# Patient Record
Sex: Female | Born: 1995 | Race: White | Hispanic: No | Smoking: Never smoker
Health system: Southern US, Community
[De-identification: ages and names within clinical notes are randomized; demographics above are authoritative.]

## PROBLEM LIST (undated history)

## (undated) DIAGNOSIS — M419 Scoliosis, unspecified: Secondary | ICD-10-CM

## (undated) HISTORY — DX: Scoliosis, unspecified: M41.9

## (undated) HISTORY — PX: WISDOM TOOTH EXTRACTION: SHX21

---
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
## 2013-01-17 NOTE — L&D Delivery Note (Signed)
Delivery Note At 5:10 PM a viable and healthy female was delivered via Vaginal, Spontaneous Delivery (Presentation: ; Occiput Anterior).  APGAR: 9, 9; weight  .   Placenta status: intact: Schultz,Cord:  with the following complications: Long.   Anesthesia: Epidural  Episiotomy: None Lacerations: 1st degree; not reparied Suture Repair: N/A Est. Blood Loss (mL): 250 Mother doing well, bonding with baby skin to skin Family at bedside for support  Mom to postpartum.  Baby to Couplet care / Skin to Skin.  Sheryl Pitts,Sheryl Pitts 11/27/2013, 5:30 PM   Attended by me No difficulty with shoulders Agree with note  Sheryl Pitts, CNM

## 2013-05-02 ENCOUNTER — Encounter (HOSPITAL_COMMUNITY): Payer: Self-pay | Admitting: Emergency Medicine

## 2013-05-02 ENCOUNTER — Emergency Department (HOSPITAL_COMMUNITY)
Admission: EM | Admit: 2013-05-02 | Discharge: 2013-05-03 | Disposition: A | Discharge status: Home / Self Care | Payer: Medicaid Other | Attending: Emergency Medicine | Admitting: Emergency Medicine

## 2013-05-02 DIAGNOSIS — O219 Vomiting of pregnancy, unspecified: Secondary | ICD-10-CM | POA: Insufficient documentation

## 2013-05-02 DIAGNOSIS — R1032 Left lower quadrant pain: Secondary | ICD-10-CM | POA: Insufficient documentation

## 2013-05-02 DIAGNOSIS — O9989 Other specified diseases and conditions complicating pregnancy, childbirth and the puerperium: Secondary | ICD-10-CM | POA: Insufficient documentation

## 2013-05-02 DIAGNOSIS — R110 Nausea: Secondary | ICD-10-CM | POA: Insufficient documentation

## 2013-05-02 DIAGNOSIS — N939 Abnormal uterine and vaginal bleeding, unspecified: Secondary | ICD-10-CM

## 2013-05-02 DIAGNOSIS — O209 Hemorrhage in early pregnancy, unspecified: Secondary | ICD-10-CM | POA: Insufficient documentation

## 2013-05-02 DIAGNOSIS — Z3490 Encounter for supervision of normal pregnancy, unspecified, unspecified trimester: Secondary | ICD-10-CM

## 2013-05-02 DIAGNOSIS — R1031 Right lower quadrant pain: Secondary | ICD-10-CM | POA: Insufficient documentation

## 2013-05-02 LAB — CBC WITH DIFFERENTIAL/PLATELET
Basophils Absolute: 0 10*3/uL (ref 0.0–0.1)
Basophils Relative: 0 % (ref 0–1)
EOS ABS: 0.1 10*3/uL (ref 0.0–0.7)
EOS PCT: 1 % (ref 0–5)
HEMATOCRIT: 39.8 % (ref 36.0–46.0)
Hemoglobin: 13.6 g/dL (ref 12.0–15.0)
LYMPHS ABS: 3.2 10*3/uL (ref 0.7–4.0)
Lymphocytes Relative: 23 % (ref 12–46)
MCH: 30.7 pg (ref 26.0–34.0)
MCHC: 34.2 g/dL (ref 30.0–36.0)
MCV: 89.8 fL (ref 78.0–100.0)
MONO ABS: 1 10*3/uL (ref 0.1–1.0)
Monocytes Relative: 7 % (ref 3–12)
Neutro Abs: 9.7 10*3/uL — ABNORMAL HIGH (ref 1.7–7.7)
Neutrophils Relative %: 69 % (ref 43–77)
PLATELETS: 206 10*3/uL (ref 150–400)
RBC: 4.43 MIL/uL (ref 3.87–5.11)
RDW: 12.5 % (ref 11.5–15.5)
WBC: 14 10*3/uL — ABNORMAL HIGH (ref 4.0–10.5)

## 2013-05-02 LAB — I-STAT CHEM 8, ED
BUN: 3 mg/dL — AB (ref 6–23)
CHLORIDE: 105 meq/L (ref 96–112)
CREATININE: 0.6 mg/dL (ref 0.50–1.10)
Calcium, Ion: 1.18 mmol/L (ref 1.12–1.23)
GLUCOSE: 92 mg/dL (ref 70–99)
HCT: 42 % (ref 36.0–46.0)
Hemoglobin: 14.3 g/dL (ref 12.0–15.0)
POTASSIUM: 3.7 meq/L (ref 3.7–5.3)
SODIUM: 139 meq/L (ref 137–147)
TCO2: 19 mmol/L (ref 0–100)

## 2013-05-02 LAB — URINALYSIS, ROUTINE W REFLEX MICROSCOPIC
BILIRUBIN URINE: NEGATIVE
GLUCOSE, UA: NEGATIVE mg/dL
KETONES UR: NEGATIVE mg/dL
Nitrite: NEGATIVE
PROTEIN: NEGATIVE mg/dL
Specific Gravity, Urine: 1.028 (ref 1.005–1.030)
Urobilinogen, UA: 1 mg/dL (ref 0.0–1.0)
pH: 7 (ref 5.0–8.0)

## 2013-05-02 LAB — POC URINE PREG, ED: Preg Test, Ur: POSITIVE — AB

## 2013-05-02 LAB — WET PREP, GENITAL
CLUE CELLS WET PREP: NONE SEEN
TRICH WET PREP: NONE SEEN
Yeast Wet Prep HPF POC: NONE SEEN

## 2013-05-02 LAB — ABO/RH: ABO/RH(D): O POS

## 2013-05-02 LAB — HCG, QUANTITATIVE, PREGNANCY: hCG, Beta Chain, Quant, S: 70614 m[IU]/mL — ABNORMAL HIGH (ref <5)

## 2013-05-02 LAB — URINE MICROSCOPIC-ADD ON

## 2013-05-02 LAB — OB RESULTS CONSOLE GC/CHLAMYDIA
CHLAMYDIA, DNA PROBE: NEGATIVE
Gonorrhea: NEGATIVE

## 2013-05-02 NOTE — ED Notes (Addendum)
Pt reports that she is having vaginal bleeding that started this evening. Pt noticed that the bleeding first covered her underwear then it lightened up. Pt states that she is unconfirmed [redacted] weeks pregnant with + home preg test and experiencing vomiting. Pt states that she noticed blood in vomit then some bile afterwards. Pt states that she has lower ab cramping that comes and goes. Pt alert and ambulatory to triage. Family in room.

## 2013-05-02 NOTE — ED Provider Notes (Signed)
CSN: [id number]     Arrival date & time 05/02/13  2104 History   First MD Initiated Contact with Patient 05/02/13 2249     Chief Complaint  Patient presents with  . Abdominal Pain  . Emesis     (Consider location/radiation/quality/duration/timing/severity/associated sxs/prior Treatment) The history is provided by the patient and medical records. No language interpreter was used.    Sheryl Pitts is a 18 y.o. female  G1P0 with no known medical problems presents to the Emergency Department complaining of intermittent lower abd pain onset 8pm and had associated vaginal bleeding noted several minutes after the pain began.  Pt also reports N/V around 8:30PM tonight.  Pt reports she has had some nausea and vomiting with the pregnancy in the past.  She reports several streaks of blood in the emesis on the last time, but no bilious matter.  Patient reports she is [redacted] weeks pregnant based on her last menstrual cycle which was in February.   She has not yet received any prenatal care but she is taking a prenatal vitamin.   She reports positive home pregnancy test. Patient has no aggravating or alleviating factors.  Pt denies fever, chills, headache, neck pain, chest pain, shortness of breath, diarrhea, weakness, dizziness, syncope, dysuria, hematuria.Marland Kitchen      History reviewed. No pertinent past medical history. No past surgical history on file. No family history on file. History  Substance Use Topics  . Smoking status: Never Smoker   . Smokeless tobacco: Not on file  . Alcohol Use: Not on file   OB History   Grav Para Term Preterm Abortions TAB SAB Ect Mult Living   1              Review of Systems  Constitutional: Negative for fever, diaphoresis, appetite change, fatigue and unexpected weight change.  HENT: Negative for mouth sores and trouble swallowing.   Respiratory: Negative for cough, chest tightness, shortness of breath, wheezing and stridor.   Cardiovascular: Negative for chest pain and  palpitations.  Gastrointestinal: Positive for nausea, vomiting and abdominal pain (lower). Negative for diarrhea, constipation, blood in stool, abdominal distention and rectal pain.  Genitourinary: Positive for vaginal bleeding. Negative for dysuria, urgency, frequency, hematuria, flank pain and difficulty urinating.  Musculoskeletal: Negative for back pain, neck pain and neck stiffness.  Skin: Negative for rash.  Neurological: Negative for weakness.  Hematological: Negative for adenopathy.  Psychiatric/Behavioral: Negative for confusion.  All other systems reviewed and are negative.     Allergies  Review of patient's allergies indicates no known allergies.  Home Medications   Prior to Admission medications   Medication Sig Start Date End Date Taking? Authorizing Provider  Prenatal Vit-Fe Fumarate-FA (PRENATAL MULTIVITAMIN) TABS tablet Take 1 tablet by mouth daily at 12 noon.   Yes Historical Provider, MD   BP 118/48  Pulse 72  Temp(Src) 99.9 F (37.7 C) (Oral)  Resp 20  SpO2 99%  LMP 02/17/2013 Physical Exam  Nursing note and vitals reviewed. Constitutional: She appears well-developed and well-nourished. No distress.  Awake, alert, nontoxic appearance  HENT:  Head: Normocephalic and atraumatic.  Mouth/Throat: Oropharynx is clear and moist. No oropharyngeal exudate.  Eyes: Conjunctivae are normal. No scleral icterus.  Neck: Normal range of motion. Neck supple.  Cardiovascular: Normal rate, regular rhythm, normal heart sounds and intact distal pulses.   No murmur heard. Pulmonary/Chest: Effort normal and breath sounds normal. No respiratory distress. She has no wheezes.  Clear and equal breath sounds  Abdominal: Soft.  Bowel sounds are normal. She exhibits no mass. There is no hepatosplenomegaly. There is tenderness in the right lower quadrant, suprapubic area and left lower quadrant. There is no rebound, no guarding and no CVA tenderness. Hernia confirmed negative in the  right inguinal area and confirmed negative in the left inguinal area.    Genitourinary: Uterus normal. Pelvic exam was performed with patient supine. There is no rash, tenderness, lesion or injury on the right labia. There is no rash, tenderness, lesion or injury on the left labia. Uterus is not deviated, not enlarged, not fixed and not tender. Cervix exhibits no motion tenderness, no discharge and no friability. Right adnexum displays no mass, no tenderness and no fullness. Left adnexum displays no mass, no tenderness and no fullness. There is bleeding (small) around the vagina. No erythema or tenderness around the vagina. No foreign body around the vagina. No signs of injury around the vagina. Vaginal discharge (thin) found.  No CMT  Musculoskeletal: Normal range of motion. She exhibits no edema.  Lymphadenopathy:    She has no cervical adenopathy.       Right: No inguinal adenopathy present.       Left: No inguinal adenopathy present.  Neurological: She is alert.  Speech is clear and goal oriented Moves extremities without ataxia  Skin: Skin is warm and dry. No rash noted. She is not diaphoretic.  Psychiatric: She has a normal mood and affect.    ED Course  Procedures (including critical care time) Labs Review Labs Reviewed  WET PREP, GENITAL - Abnormal; Notable for the following:    WBC, Wet Prep HPF POC MANY (*)    All other components within normal limits  CBC WITH DIFFERENTIAL - Abnormal; Notable for the following:    WBC 14.0 (*)    Neutro Abs 9.7 (*)    All other components within normal limits  URINALYSIS, ROUTINE W REFLEX MICROSCOPIC - Abnormal; Notable for the following:    APPearance CLOUDY (*)    Hgb urine dipstick MODERATE (*)    Leukocytes, UA MODERATE (*)    All other components within normal limits  HCG, QUANTITATIVE, PREGNANCY - Abnormal; Notable for the following:    hCG, Beta Chain, Sheryl Pitts, S [phone] (*)    All other components within normal limits  URINE  MICROSCOPIC-ADD ON - Abnormal; Notable for the following:    Squamous Epithelial / LPF FEW (*)    Bacteria, UA MANY (*)    All other components within normal limits  POC URINE PREG, ED - Abnormal; Notable for the following:    Preg Test, Ur POSITIVE (*)    All other components within normal limits  I-STAT CHEM 8, ED - Abnormal; Notable for the following:    BUN 3 (*)    All other components within normal limits  GC/CHLAMYDIA PROBE AMP  URINE CULTURE  ABO/RH    Imaging Review No results found.   EKG Interpretation None      MDM   Final diagnoses:  Vaginal bleeding  Pregnancy     Sheryl Pitts presents at approximately [redacted] weeks gestation with vaginal bleeding and lower abdominal cramping for several hours. Patient O+ and does not need RhoGAM. Urinalysis with evidence of questionable urinary tract infection we'll treat and culture. Urine pregnancy test positive.  Pelvic exam with small amount of blood in the vaginal vault and then discharge, nonodorous. No cervical motion tenderness.  Will obtain ultrasound OB to rule out ectopic pregnancy.  1:59 AM US verbal  report shows IUP at 11 weeks and 1 day with FHR 171 and no ectopic pregnancy.  Pt tolerating by mouth without difficulty here in the emergency department. She also reports spontaneously resolved abd pain.  Pt has been instructed to f/u with OB/GYN tomorrow for further evaluation.    It has been determined that no acute conditions requiring further emergency intervention are present at this time. The patient/guardian have been advised of the diagnosis and plan. We have discussed signs and symptoms that warrant return to the ED, such as changes or worsening in symptoms including fever, intractable vomiting or increasing vaginal bleeding.   Vital signs are stable at discharge.   BP 118/48  Pulse 72  Temp(Src) 99.9 F (37.7 C) (Oral)  Resp 20  SpO2 99%  LMP 02/17/2013  Patient/guardian has voiced understanding and agreed  to follow-up with the PCP or specialist.      Sheryl ForthHannah Cailee Blanke, PA-C 05/03/13 [phone]

## 2013-05-03 ENCOUNTER — Emergency Department (HOSPITAL_COMMUNITY): Payer: Medicaid Other

## 2013-05-03 LAB — GC/CHLAMYDIA PROBE AMP
CT Probe RNA: NEGATIVE
GC PROBE AMP APTIMA: NEGATIVE

## 2013-05-03 MED ORDER — NITROFURANTOIN MONOHYD MACRO 100 MG PO CAPS: 100.0000 mg | ORAL_CAPSULE | Freq: Two times a day (BID) | ORAL | Status: DC

## 2013-05-03 MED ORDER — ONDANSETRON 8 MG PO TBDP: ORAL_TABLET | ORAL | Status: DC

## 2013-05-03 NOTE — ED Provider Notes (Signed)
Medical screening examination/treatment/procedure(s) were performed by non-physician practitioner and as supervising physician I was immediately available for consultation/collaboration.   EKG Interpretation None        Adahlia Stembridge L Bertel Venard, MD 05/03/13 0527 

## 2013-05-03 NOTE — Discharge Instructions (Signed)
1. Medications: zofran, usual home medications 2. Treatment: rest, drink plenty of fluids,  3. Follow Up: Please followup with OB/GYN in 24-48 hours for discussion of your diagnoses and further evaluation after today's visit; please [facility] for further vaginal bleeding or increasing or concerning symptoms including high fevers or intractable vomiting   Vaginal Bleeding During Pregnancy, First Trimester A small amount of bleeding (spotting) from the vagina is relatively common in early pregnancy. It usually stops on its own. Various things may cause bleeding or spotting in early pregnancy. Some bleeding may be related to the pregnancy, and some may not. In most cases, the bleeding is normal and is not a problem. However, bleeding can also be a sign of something serious. Be sure to tell your health care provider about any vaginal bleeding right away. Some possible causes of vaginal bleeding during the first trimester include:  Infection or inflammation of the cervix.  Growths (polyps) on the cervix.  Miscarriage or threatened miscarriage.  Pregnancy tissue has developed outside of the uterus and in a fallopian tube (tubal pregnancy).  Tiny cysts have developed in the uterus instead of pregnancy tissue (molar pregnancy). HOME CARE INSTRUCTIONS  Watch your condition for any changes. The following actions may help to lessen any discomfort you are feeling:  Follow your health care provider's instructions for limiting your activity. If your health care provider orders bed rest, you may need to stay in bed and only get up to use the bathroom. However, your health care provider may allow you to continue light activity.  If needed, make plans for someone to help with your regular activities and responsibilities while you are on bed rest.  Keep track of the number of pads you use each day, how often you change pads, and how soaked (saturated) they are. Write this down.  Do  not use tampons. Do not douche.  Do not have sexual intercourse or orgasms until approved by your health care provider.  If you pass any tissue from your vagina, save the tissue so you can show it to your health care provider.  Only take over-the-counter or prescription medicines as directed by your health care provider.  Do not take aspirin because it can make you bleed.  Keep all follow-up appointments as directed by your health care provider. SEEK MEDICAL CARE IF:  You have any vaginal bleeding during any part of your pregnancy.  You have cramps or labor pains. SEEK IMMEDIATE MEDICAL CARE IF:   You have severe cramps in your back or belly (abdomen).  You have a fever, not controlled by medicine.  You pass large clots or tissue from your vagina.  Your bleeding increases.  You feel lightheaded or weak, or you have fainting episodes.  You have chills.  You are leaking fluid or have a gush of fluid from your vagina.  You pass out while having a bowel movement. MAKE SURE YOU:  Understand these instructions.  Will watch your condition.  Will get help right away if you are not doing well or get worse. Document Released: 10/13/2004 Document Revised: 10/24/2012 Document Reviewed: 09/10/2012 [facility] Patient Information 2014 [employer], [place].

## 2013-05-04 LAB — URINE CULTURE

## 2013-05-23 ENCOUNTER — Encounter: Payer: Medicaid Other | Admitting: Medical

## 2013-05-23 ENCOUNTER — Telehealth: Payer: Self-pay

## 2013-05-23 ENCOUNTER — Encounter: Payer: Self-pay | Admitting: Medical

## 2013-05-23 NOTE — Telephone Encounter (Signed)
Per Joseph BerkshireJulie Ethier, PA, patient does not need to be seen for f/u evaluation of bleeding on 05/02/13 in [facility]. Ultrasound on 4/16 confirms viable IUP 11w 0d. Pt. Needs a new OB appointment. Called pt. To see how her symptoms have been. Pt. Stated she spotted for 1.5 weeks after ED visit, it stopped, and then she started spotting two weeks ago, it stopped a few days ago. Pt. States she is not spotting today. Denies ever bleeding heavily after ED visit. Denies pain or cramping. Consulted Joseph BerkshireJulie Ethier, NP who states pt. Should have new OB appointment, no reason to be seen today. Advised pt. That should she develop any heavy bleeding, cramping, pain she needs to go to [facility]. Pt. Verbalized understanding and asks if she should start a PNV. Advised pt. She may get any PNV from any pharmacy. Pt. Also asked about ultrasound to see the sex of the baby. Informed pt. We do that between 18-20 weeks so at the time of her appointment we will be able to schedule the ultrasound. Pt. Verbalized understanding and gratitude. No further questions or concerns.

## 2013-06-26 ENCOUNTER — Ambulatory Visit: Payer: BC Managed Care – PPO | Admitting: Family

## 2013-06-26 ENCOUNTER — Encounter: Payer: Self-pay | Admitting: Family

## 2013-06-26 VITALS — Ht 66.0 in | Wt 232.4 lb

## 2013-06-26 DIAGNOSIS — O0930 Supervision of pregnancy with insufficient antenatal care, unspecified trimester: Secondary | ICD-10-CM | POA: Insufficient documentation

## 2013-06-26 DIAGNOSIS — O0932 Supervision of pregnancy with insufficient antenatal care, second trimester: Secondary | ICD-10-CM

## 2013-06-26 LAB — POCT URINALYSIS DIP (DEVICE)
Bilirubin Urine: NEGATIVE
GLUCOSE, UA: NEGATIVE mg/dL
HGB URINE DIPSTICK: NEGATIVE
Ketones, ur: NEGATIVE mg/dL
NITRITE: NEGATIVE
PROTEIN: NEGATIVE mg/dL
SPECIFIC GRAVITY, URINE: 1.02 (ref 1.005–1.030)
UROBILINOGEN UA: 0.2 mg/dL (ref 0.0–1.0)
pH: 6.5 (ref 5.0–8.0)

## 2013-06-26 MED ORDER — FLUCONAZOLE 150 MG PO TABS: 150.0000 mg | ORAL_TABLET | Freq: Once | ORAL | Status: DC

## 2013-06-26 NOTE — Progress Notes (Signed)
   Subjective:    Sheryl Pitts is a G1P0 [redacted]w[redacted]d being seen today for her first obstetrical visit.  Her obstetrical history is significant for bleeding in early pregnancy. Patient does intend to breast feed. Pregnancy history fully reviewed.  At visit with FOB Hoy Morn.  Met in high school.    Patient reports no bleeding, no contractions and vaginal irritation.  Filed Vitals:   06/26/13 1001 06/26/13 1006  Height:  $Remove'5\' 6"'TfdlxSd$  (1.676 m)  Weight: 232 lb 6.4 oz (105.416 kg)     HISTORY: OB History  Gravida Para Term Preterm AB SAB TAB Ectopic Multiple Living  1             # Outcome Date GA Lbr Len/2nd Weight Sex Delivery Anes PTL Lv  1 CUR              Past Medical History  Diagnosis Date  . Medical history non-contributory    Past Surgical History  Procedure Laterality Date  . Wisdom tooth extraction     Family History  Problem Relation Age of Onset  . Hypertension Father   . Diabetes Neg Hx      Exam   Exam   Ht $R'5\' 6"'Nq$  (1.676 m)  Wt 232 lb 6.4 oz (105.416 kg)  BMI 37.53 kg/m2  LMP 02/17/2013 Uterine Size: size equals dates  System: Breast:  No nipple retraction or dimpling, No nipple discharge or bleeding, No axillary or supraclavicular adenopathy, Normal to palpation without dominant masses   Skin: normal coloration and turgor, no rashes    Neurologic: negative   Extremities: normal strength, tone, and muscle mass   HEENT neck supple with midline trachea and thyroid without masses   Mouth/Teeth mucous membranes moist, pharynx normal without lesions   Neck supple and no masses   Cardiovascular: regular rate and rhythm, no murmurs or gallops   Respiratory:  appears well, vitals normal, no respiratory distress, acyanotic, normal RR, neck free of mass or lymphadenopathy, chest clear, no wheezing, crepitations, rhonchi, normal symmetric air entry   Abdomen: soft, non-tender; bowel sounds normal; no masses,  no organomegaly   Urinary: urethral meatus normal         Assessment:    Pregnancy:  18 yo G1P0 at [redacted]w[redacted]d wks IUP Vaginitis - Likely Yeast  There are no active problems to display for this patient.       Plan:     Initial labs drawn. Early 1 hr glucola. Prenatal vitamins. RX Diflucan Problem list reviewed and updated. Genetic Screening discussed Quad Screen: declined. Ultrasound discussed; fetal survey: ordered. Follow up in 4 weeks.   [facility] 06/26/2013

## 2013-06-26 NOTE — Progress Notes (Signed)
1hr gtt today due to pregravida bmi of >30. Discussed recommended weight gain of 11-20 lbs.

## 2013-06-27 LAB — OBSTETRIC PANEL
Antibody Screen: NEGATIVE
BASOS ABS: 0 10*3/uL (ref 0.0–0.1)
Basophils Relative: 0 % (ref 0–1)
EOS ABS: 0.1 10*3/uL (ref 0.0–0.7)
EOS PCT: 1 % (ref 0–5)
HCT: 36.8 % (ref 36.0–46.0)
HEP B S AG: NEGATIVE
Hemoglobin: 12.9 g/dL (ref 12.0–15.0)
LYMPHS ABS: 1.7 10*3/uL (ref 0.7–4.0)
Lymphocytes Relative: 16 % (ref 12–46)
MCH: 30.9 pg (ref 26.0–34.0)
MCHC: 35.1 g/dL (ref 30.0–36.0)
MCV: 88 fL (ref 78.0–100.0)
Monocytes Absolute: 0.6 10*3/uL (ref 0.1–1.0)
Monocytes Relative: 6 % (ref 3–12)
NEUTROS PCT: 77 % (ref 43–77)
Neutro Abs: 8.1 10*3/uL — ABNORMAL HIGH (ref 1.7–7.7)
PLATELETS: 198 10*3/uL (ref 150–400)
RBC: 4.18 MIL/uL (ref 3.87–5.11)
RDW: 13.4 % (ref 11.5–15.5)
Rh Type: POSITIVE
Rubella: 3.95 Index — ABNORMAL HIGH (ref <0.90)
WBC: 10.5 10*3/uL (ref 4.0–10.5)

## 2013-06-27 LAB — GLUCOSE TOLERANCE, 1 HOUR (50G) W/O FASTING: GLUCOSE 1 HOUR GTT: 115 mg/dL (ref 70–140)

## 2013-06-27 LAB — HIV ANTIBODY (ROUTINE TESTING W REFLEX): HIV: NONREACTIVE

## 2013-06-28 LAB — PRESCRIPTION MONITORING PROFILE (19 PANEL)
AMPHETAMINE/METH: NEGATIVE ng/mL
Barbiturate Screen, Urine: NEGATIVE ng/mL
Benzodiazepine Screen, Urine: NEGATIVE ng/mL
Buprenorphine, Urine: NEGATIVE ng/mL
CARISOPRODOL, URINE: NEGATIVE ng/mL
CREATININE, URINE: 185.5 mg/dL (ref >20.0)
Cannabinoid Scrn, Ur: NEGATIVE ng/mL
Cocaine Metabolites: NEGATIVE ng/mL
ECSTASY: NEGATIVE ng/mL
Fentanyl, Ur: NEGATIVE ng/mL
MEPERIDINE UR: NEGATIVE ng/mL
METHADONE SCREEN, URINE: NEGATIVE ng/mL
METHAQUALONE SCREEN (URINE): NEGATIVE ng/mL
NITRITES URINE, INITIAL: NEGATIVE ug/mL
OXYCODONE SCRN UR: NEGATIVE ng/mL
Opiate Screen, Urine: NEGATIVE ng/mL
PH URINE, INITIAL: 6.8 pH (ref 4.5–8.9)
PROPOXYPHENE: NEGATIVE ng/mL
Phencyclidine, Ur: NEGATIVE ng/mL
TAPENTADOLUR: NEGATIVE ng/mL
TRAMADOL UR: NEGATIVE ng/mL
Zolpidem, Urine: NEGATIVE ng/mL

## 2013-06-28 LAB — CULTURE, OB URINE: Colony Count: 9000

## 2013-07-02 ENCOUNTER — Encounter: Payer: Self-pay | Admitting: Family

## 2013-07-03 ENCOUNTER — Ambulatory Visit (HOSPITAL_COMMUNITY)
Admission: RE | Admit: 2013-07-03 | Discharge: 2013-07-03 | Disposition: A | Discharge status: Home / Self Care | Payer: Medicaid Other | Source: Ambulatory Visit | Attending: Family | Admitting: Family

## 2013-07-03 DIAGNOSIS — O3680X0 Pregnancy with inconclusive fetal viability, not applicable or unspecified: Secondary | ICD-10-CM | POA: Insufficient documentation

## 2013-07-03 DIAGNOSIS — O0932 Supervision of pregnancy with insufficient antenatal care, second trimester: Secondary | ICD-10-CM

## 2013-07-03 DIAGNOSIS — Z3689 Encounter for other specified antenatal screening: Secondary | ICD-10-CM | POA: Insufficient documentation

## 2013-07-04 ENCOUNTER — Encounter: Payer: Self-pay | Admitting: Family

## 2013-07-24 ENCOUNTER — Encounter: Payer: Medicaid Other | Admitting: Advanced Practice Midwife

## 2013-07-31 ENCOUNTER — Ambulatory Visit (INDEPENDENT_AMBULATORY_CARE_PROVIDER_SITE_OTHER): Payer: Medicaid Other | Admitting: Advanced Practice Midwife

## 2013-07-31 VITALS — BP 138/68 | HR 97 | Temp 98.6°F | Wt 241.7 lb

## 2013-07-31 DIAGNOSIS — Z3402 Encounter for supervision of normal first pregnancy, second trimester: Secondary | ICD-10-CM

## 2013-07-31 DIAGNOSIS — Z3400 Encounter for supervision of normal first pregnancy, unspecified trimester: Secondary | ICD-10-CM

## 2013-07-31 LAB — POCT URINALYSIS DIP (DEVICE)
Bilirubin Urine: NEGATIVE
Glucose, UA: NEGATIVE mg/dL
Hgb urine dipstick: NEGATIVE
Ketones, ur: NEGATIVE mg/dL
NITRITE: NEGATIVE
PROTEIN: NEGATIVE mg/dL
Specific Gravity, Urine: 1.02 (ref 1.005–1.030)
UROBILINOGEN UA: 0.2 mg/dL (ref 0.0–1.0)
pH: 6.5 (ref 5.0–8.0)

## 2013-07-31 MED ORDER — PRENATAL PLUS 27-1 MG PO TABS: 1.0000 | ORAL_TABLET | Freq: Every day | ORAL | Status: DC

## 2013-07-31 NOTE — Patient Instructions (Signed)
Second Trimester of Pregnancy The second trimester is from week 13 through week 28, months 4 through 6. The second trimester is often a time when you feel your best. Your body has also adjusted to being pregnant, and you begin to feel better physically. Usually, morning sickness has lessened or quit completely, you may have more energy, and you may have an increase in appetite. The second trimester is also a time when the fetus is growing rapidly. At the end of the sixth month, the fetus is about 9 inches long and weighs about 1 pounds. You will likely begin to feel the baby move (quickening) between 18 and 20 weeks of the pregnancy. BODY CHANGES Your body goes through many changes during pregnancy. The changes vary from woman to woman.   Your weight will continue to increase. You will notice your lower abdomen bulging out.  You may begin to get stretch marks on your hips, abdomen, and breasts.  You may develop headaches that can be relieved by medicines approved by your health care provider.  You may urinate more often because the fetus is pressing on your bladder.  You may develop or continue to have heartburn as a result of your pregnancy.  You may develop constipation because certain hormones are causing the muscles that push waste through your intestines to slow down.  You may develop hemorrhoids or swollen, bulging veins (varicose veins).  You may have back pain because of the weight gain and pregnancy hormones relaxing your joints between the bones in your pelvis and as a result of a shift in weight and the muscles that support your balance.  Your breasts will continue to grow and be tender.  Your gums may bleed and may be sensitive to brushing and flossing.  Dark spots or blotches (chloasma, mask of pregnancy) may develop on your face. This will likely fade after the baby is born.  A dark line from your belly button to the pubic area (linea nigra) may appear. This will likely fade  after the baby is born.  You may have changes in your hair. These can include thickening of your hair, rapid growth, and changes in texture. Some women also have hair loss during or after pregnancy, or hair that feels dry or thin. Your hair will most likely return to normal after your baby is born. WHAT TO EXPECT AT YOUR PRENATAL VISITS During a routine prenatal visit:  You will be weighed to make sure you and the fetus are growing normally.  Your blood pressure will be taken.  Your abdomen will be measured to track your baby's growth.  The fetal heartbeat will be listened to.  Any test results from the previous visit will be discussed. Your health care provider may ask you:  How you are feeling.  If you are feeling the baby move.  If you have had any abnormal symptoms, such as leaking fluid, bleeding, severe headaches, or abdominal cramping.  If you have any questions. Other tests that may be performed during your second trimester include:  Blood tests that check for:  Low iron levels (anemia).  Gestational diabetes (between 24 and 28 weeks).  Rh antibodies.  Urine tests to check for infections, diabetes, or protein in the urine.  An ultrasound to confirm the proper growth and development of the baby.  An amniocentesis to check for possible genetic problems.  Fetal screens for spina bifida and Down syndrome. HOME CARE INSTRUCTIONS   Avoid all smoking, herbs, alcohol, and unprescribed   drugs. These chemicals affect the formation and growth of the baby.  Follow your health care provider's instructions regarding medicine use. There are medicines that are either safe or unsafe to take during pregnancy.  Exercise only as directed by your health care provider. Experiencing uterine cramps is a good sign to stop exercising.  Continue to eat regular, healthy meals.  Wear a good support bra for breast tenderness.  Do not use hot tubs, steam rooms, or saunas.  Wear your  seat belt at all times when driving.  Avoid raw meat, uncooked cheese, cat litter boxes, and soil used by cats. These carry germs that can cause birth defects in the baby.  Take your prenatal vitamins.  Try taking a stool softener (if your health care provider approves) if you develop constipation. Eat more high-fiber foods, such as fresh vegetables or fruit and whole grains. Drink plenty of fluids to keep your urine clear or pale yellow.  Take warm sitz baths to soothe any pain or discomfort caused by hemorrhoids. Use hemorrhoid cream if your health care provider approves.  If you develop varicose veins, wear support hose. Elevate your feet for 15 minutes, 3-4 times a day. Limit salt in your diet.  Avoid heavy lifting, wear low heel shoes, and practice good posture.  Rest with your legs elevated if you have leg cramps or low back pain.  Visit your dentist if you have not gone yet during your pregnancy. Use a soft toothbrush to brush your teeth and be gentle when you floss.  A sexual relationship may be continued unless your health care provider directs you otherwise.  Continue to go to all your prenatal visits as directed by your health care provider. SEEK MEDICAL CARE IF:   You have dizziness.  You have mild pelvic cramps, pelvic pressure, or nagging pain in the abdominal area.  You have persistent nausea, vomiting, or diarrhea.  You have a bad smelling vaginal discharge.  You have pain with urination. SEEK IMMEDIATE MEDICAL CARE IF:   You have a fever.  You are leaking fluid from your vagina.  You have spotting or bleeding from your vagina.  You have severe abdominal cramping or pain.  You have rapid weight gain or loss.  You have shortness of breath with chest pain.  You notice sudden or extreme swelling of your face, hands, ankles, feet, or legs.  You have not felt your baby move in over an hour.  You have severe headaches that do not go away with  medicine.  You have vision changes. Document Released: 12/28/2000 Document Revised: 01/08/2013 Document Reviewed: 03/06/2012 [employer] Patient Information 2015 [employer]. This information is not intended to replace advice given to you by your health care provider. Make sure you discuss any questions you have with your health care provider.  

## 2013-07-31 NOTE — Progress Notes (Signed)
Incomplete anatomy. F/U scheduled. Labs reviewed. C/Osuperficial tenderness and ? bruising of LLQ. Worse w/ walking and palpation. No bruising seen. Blood vessel in visible beneath stretch mark. Likely having RLP and normal discomforts of second trimester. Maternity support belt.

## 2013-07-31 NOTE — Progress Notes (Signed)
Pt states she has bruising on left side of abdomen and it is tender to touch.

## 2013-08-07 ENCOUNTER — Ambulatory Visit (HOSPITAL_COMMUNITY)
Admission: RE | Admit: 2013-08-07 | Discharge: 2013-08-07 | Disposition: A | Discharge status: Home / Self Care | Payer: BC Managed Care – PPO | Source: Ambulatory Visit | Attending: Advanced Practice Midwife | Admitting: Advanced Practice Midwife

## 2013-08-07 DIAGNOSIS — Z3689 Encounter for other specified antenatal screening: Secondary | ICD-10-CM | POA: Diagnosis not present

## 2013-08-07 DIAGNOSIS — Z3402 Encounter for supervision of normal first pregnancy, second trimester: Secondary | ICD-10-CM

## 2013-08-08 ENCOUNTER — Encounter: Payer: Self-pay | Admitting: Advanced Practice Midwife

## 2013-08-08 DIAGNOSIS — O283 Abnormal ultrasonic finding on antenatal screening of mother: Secondary | ICD-10-CM | POA: Insufficient documentation

## 2013-09-04 ENCOUNTER — Encounter: Payer: Self-pay | Admitting: Physician Assistant

## 2013-09-04 ENCOUNTER — Ambulatory Visit (INDEPENDENT_AMBULATORY_CARE_PROVIDER_SITE_OTHER): Payer: Medicaid Other | Admitting: Physician Assistant

## 2013-09-04 VITALS — BP 130/60 | HR 85 | Wt 247.9 lb

## 2013-09-04 DIAGNOSIS — O0930 Supervision of pregnancy with insufficient antenatal care, unspecified trimester: Secondary | ICD-10-CM

## 2013-09-04 DIAGNOSIS — O0933 Supervision of pregnancy with insufficient antenatal care, third trimester: Secondary | ICD-10-CM

## 2013-09-04 DIAGNOSIS — O283 Abnormal ultrasonic finding on antenatal screening of mother: Secondary | ICD-10-CM

## 2013-09-04 DIAGNOSIS — O289 Unspecified abnormal findings on antenatal screening of mother: Secondary | ICD-10-CM

## 2013-09-04 LAB — CBC
HCT: 36.1 % (ref 36.0–46.0)
Hemoglobin: 12.4 g/dL (ref 12.0–15.0)
MCH: 29.7 pg (ref 26.0–34.0)
MCHC: 34.3 g/dL (ref 30.0–36.0)
MCV: 86.6 fL (ref 78.0–100.0)
PLATELETS: 191 10*3/uL (ref 150–400)
RBC: 4.17 MIL/uL (ref 3.87–5.11)
RDW: 13.6 % (ref 11.5–15.5)
WBC: 11.1 10*3/uL — AB (ref 4.0–10.5)

## 2013-09-04 LAB — POCT URINALYSIS DIP (DEVICE)
Bilirubin Urine: NEGATIVE
GLUCOSE, UA: NEGATIVE mg/dL
Ketones, ur: NEGATIVE mg/dL
NITRITE: NEGATIVE
Protein, ur: NEGATIVE mg/dL
Specific Gravity, Urine: 1.01 (ref 1.005–1.030)
UROBILINOGEN UA: 0.2 mg/dL (ref 0.0–1.0)
pH: 7 (ref 5.0–8.0)

## 2013-09-04 NOTE — Progress Notes (Signed)
28 weeks, no complaints, denies dysuria Desires Nexplanon for contraception Desires Tdap next visit GTT today Urine Cx pending (large leuks) RTC 2 weeks

## 2013-09-04 NOTE — Progress Notes (Signed)
Patient wants to discuss tdap with her mom. She will let us know at next visit.

## 2013-09-05 LAB — RPR

## 2013-09-05 LAB — GLUCOSE TOLERANCE, 1 HOUR (50G) W/O FASTING: Glucose, 1 Hour GTT: 124 mg/dL (ref 70–140)

## 2013-09-05 LAB — HIV ANTIBODY (ROUTINE TESTING W REFLEX): HIV: NONREACTIVE

## 2013-09-06 LAB — URINE CULTURE

## 2013-09-18 ENCOUNTER — Ambulatory Visit (INDEPENDENT_AMBULATORY_CARE_PROVIDER_SITE_OTHER): Payer: Medicaid Other | Admitting: Advanced Practice Midwife

## 2013-09-18 VITALS — BP 124/55 | HR 68 | Temp 98.4°F | Wt 247.7 lb

## 2013-09-18 DIAGNOSIS — O0930 Supervision of pregnancy with insufficient antenatal care, unspecified trimester: Secondary | ICD-10-CM

## 2013-09-18 DIAGNOSIS — Z23 Encounter for immunization: Secondary | ICD-10-CM

## 2013-09-18 DIAGNOSIS — O0933 Supervision of pregnancy with insufficient antenatal care, third trimester: Secondary | ICD-10-CM

## 2013-09-18 LAB — POCT URINALYSIS DIP (DEVICE)
Bilirubin Urine: NEGATIVE
Glucose, UA: NEGATIVE mg/dL
HGB URINE DIPSTICK: NEGATIVE
Ketones, ur: NEGATIVE mg/dL
NITRITE: NEGATIVE
Protein, ur: NEGATIVE mg/dL
Specific Gravity, Urine: 1.015 (ref 1.005–1.030)
UROBILINOGEN UA: 0.2 mg/dL (ref 0.0–1.0)
pH: 7 (ref 5.0–8.0)

## 2013-09-18 MED ORDER — TETANUS-DIPHTH-ACELL PERTUSSIS 5-2.5-18.5 LF-MCG/0.5 IM SUSP: 0.5000 mL | Freq: Once | INTRAMUSCULAR | Status: DC

## 2013-09-18 NOTE — Progress Notes (Signed)
Urine culture neg last visit. TDaP today.

## 2013-09-18 NOTE — Patient Instructions (Signed)
Third Trimester of Pregnancy °The third trimester is from week 29 through week 42, months 7 through 9. The third trimester is a time when the fetus is growing rapidly. At the end of the ninth month, the fetus is about 20 inches in length and weighs 6-10 pounds.  °BODY CHANGES °Your body goes through many changes during pregnancy. The changes vary from woman to woman.  °· Your weight will continue to increase. You can expect to gain 25-35 pounds (11-16 kg) by the end of the pregnancy. °· You may begin to get stretch marks on your hips, abdomen, and breasts. °· You may urinate more often because the fetus is moving lower into your pelvis and pressing on your bladder. °· You may develop or continue to have heartburn as a result of your pregnancy. °· You may develop constipation because certain hormones are causing the muscles that push waste through your intestines to slow down. °· You may develop hemorrhoids or swollen, bulging veins (varicose veins). °· You may have pelvic pain because of the weight gain and pregnancy hormones relaxing your joints between the bones in your pelvis. Backaches may result from overexertion of the muscles supporting your posture. °· You may have changes in your hair. These can include thickening of your hair, rapid growth, and changes in texture. Some women also have hair loss during or after pregnancy, or hair that feels dry or thin. Your hair will most likely return to normal after your baby is born. °· Your breasts will continue to grow and be tender. A yellow discharge may leak from your breasts called colostrum. °· Your belly button may stick out. °· You may feel short of breath because of your expanding uterus. °· You may notice the fetus "dropping," or moving lower in your abdomen. °· You may have a bloody mucus discharge. This usually occurs a few days to a week before labor begins. °· Your cervix becomes thin and soft (effaced) near your due date. °WHAT TO EXPECT AT YOUR PRENATAL  EXAMS  °You will have prenatal exams every 2 weeks until week 36. Then, you will have weekly prenatal exams. During a routine prenatal visit: °· You will be weighed to make sure you and the fetus are growing normally. °· Your blood pressure is taken. °· Your abdomen will be measured to track your baby's growth. °· The fetal heartbeat will be listened to. °· Any test results from the previous visit will be discussed. °· You may have a cervical check near your due date to see if you have effaced. °At around 36 weeks, your caregiver will check your cervix. At the same time, your caregiver will also perform a test on the secretions of the vaginal tissue. This test is to determine if a type of bacteria, Group B streptococcus, is present. Your caregiver will explain this further. °Your caregiver may ask you: °· What your birth plan is. °· How you are feeling. °· If you are feeling the baby move. °· If you have had any abnormal symptoms, such as leaking fluid, bleeding, severe headaches, or abdominal cramping. °· If you have any questions. °Other tests or screenings that may be performed during your third trimester include: °· Blood tests that check for low iron levels (anemia). °· Fetal testing to check the health, activity level, and growth of the fetus. Testing is done if you have certain medical conditions or if there are problems during the pregnancy. °FALSE LABOR °You may feel small, irregular contractions that   eventually go away. These are called Braxton Hicks contractions, or false labor. Contractions may last for hours, days, or even weeks before true labor sets in. If contractions come at regular intervals, intensify, or become painful, it is best to be seen by your caregiver.  °SIGNS OF LABOR  °· Menstrual-like cramps. °· Contractions that are 5 minutes apart or less. °· Contractions that start on the top of the uterus and spread down to the lower abdomen and back. °· A sense of increased pelvic pressure or back  pain. °· A watery or bloody mucus discharge that comes from the vagina. °If you have any of these signs before the 37th week of pregnancy, call your caregiver right away. You need to go to the hospital to get checked immediately. °HOME CARE INSTRUCTIONS  °· Avoid all smoking, herbs, alcohol, and unprescribed drugs. These chemicals affect the formation and growth of the baby. °· Follow your caregiver's instructions regarding medicine use. There are medicines that are either safe or unsafe to take during pregnancy. °· Exercise only as directed by your caregiver. Experiencing uterine cramps is a good sign to stop exercising. °· Continue to eat regular, healthy meals. °· Wear a good support bra for breast tenderness. °· Do not use hot tubs, steam rooms, or saunas. °· Wear your seat belt at all times when driving. °· Avoid raw meat, uncooked cheese, cat litter boxes, and soil used by cats. These carry germs that can cause birth defects in the baby. °· Take your prenatal vitamins. °· Try taking a stool softener (if your caregiver approves) if you develop constipation. Eat more high-fiber foods, such as fresh vegetables or fruit and whole grains. Drink plenty of fluids to keep your urine clear or pale yellow. °· Take warm sitz baths to soothe any pain or discomfort caused by hemorrhoids. Use hemorrhoid cream if your caregiver approves. °· If you develop varicose veins, wear support hose. Elevate your feet for 15 minutes, 3-4 times a day. Limit salt in your diet. °· Avoid heavy lifting, wear low heal shoes, and practice good posture. °· Rest a lot with your legs elevated if you have leg cramps or low back pain. °· Visit your dentist if you have not gone during your pregnancy. Use a soft toothbrush to brush your teeth and be gentle when you floss. °· A sexual relationship may be continued unless your caregiver directs you otherwise. °· Do not travel far distances unless it is absolutely necessary and only with the approval  of your caregiver. °· Take prenatal classes to understand, practice, and ask questions about the labor and delivery. °· Make a trial run to the hospital. °· Pack your hospital bag. °· Prepare the baby's nursery. °· Continue to go to all your prenatal visits as directed by your caregiver. °SEEK MEDICAL CARE IF: °· You are unsure if you are in labor or if your water has broken. °· You have dizziness. °· You have mild pelvic cramps, pelvic pressure, or nagging pain in your abdominal area. °· You have persistent nausea, vomiting, or diarrhea. °· You have a bad smelling vaginal discharge. °· You have pain with urination. °SEEK IMMEDIATE MEDICAL CARE IF:  °· You have a fever. °· You are leaking fluid from your vagina. °· You have spotting or bleeding from your vagina. °· You have severe abdominal cramping or pain. °· You have rapid weight loss or gain. °· You have shortness of breath with chest pain. °· You notice sudden or extreme swelling   of your face, hands, ankles, feet, or legs. °· You have not felt your baby move in over an hour. °· You have severe headaches that do not go away with medicine. °· You have vision changes. °Document Released: 12/28/2000 Document Revised: 01/08/2013 Document Reviewed: 03/06/2012 °[employer]® Patient Information ©2015 [employer]. This information is not intended to replace advice given to you by your health care provider. Make sure you discuss any questions you have with your health care provider. °Fetal Movement Counts °Patient Name: __________________________________________________ Patient Due Date: ____________________ °Performing a fetal movement count is highly recommended in high-risk pregnancies, but it is good for every pregnant woman to do. Your health care provider may ask you to start counting fetal movements at 28 weeks of the pregnancy. Fetal movements often increase: °· After eating a full meal. °· After physical activity. °· After eating or drinking something sweet or  cold. °· At rest. °Pay attention to when you feel the baby is most active. This will help you notice a pattern of your baby's sleep and wake cycles and what factors contribute to an increase in fetal movement. It is important to perform a fetal movement count at the same time each day when your baby is normally most active.  °HOW TO COUNT FETAL MOVEMENTS °1. Find a quiet and comfortable area to sit or lie down on your left side. Lying on your left side provides the best blood and oxygen circulation to your baby. °2. Write down the day and time on a sheet of paper or in a journal. °3. Start counting kicks, flutters, swishes, rolls, or jabs in a 2-hour period. You should feel at least 10 movements within 2 hours. °4. If you do not feel 10 movements in 2 hours, wait 2-3 hours and count again. Look for a change in the pattern or not enough counts in 2 hours. °SEEK MEDICAL CARE IF: °· You feel less than 10 counts in 2 hours, tried twice. °· There is no movement in over an hour. °· The pattern is changing or taking longer each day to reach 10 counts in 2 hours. °· You feel the baby is not moving as he or she usually does. °Date: ____________ Movements: ____________ Start time: ____________ Finish time: ____________  °Date: ____________ Movements: ____________ Start time: ____________ Finish time: ____________ °Date: ____________ Movements: ____________ Start time: ____________ Finish time: ____________ °Date: ____________ Movements: ____________ Start time: ____________ Finish time: ____________ °Date: ____________ Movements: ____________ Start time: ____________ Finish time: ____________ °Date: ____________ Movements: ____________ Start time: ____________ Finish time: ____________ °Date: ____________ Movements: ____________ Start time: ____________ Finish time: ____________ °Date: ____________ Movements: ____________ Start time: ____________ Finish time: ____________  °Date: ____________ Movements: ____________ Start  time: ____________ Finish time: ____________ °Date: ____________ Movements: ____________ Start time: ____________ Finish time: ____________ °Date: ____________ Movements: ____________ Start time: ____________ Finish time: ____________ °Date: ____________ Movements: ____________ Start time: ____________ Finish time: ____________ °Date: ____________ Movements: ____________ Start time: ____________ Finish time: ____________ °Date: ____________ Movements: ____________ Start time: ____________ Finish time: ____________ °Date: ____________ Movements: ____________ Start time: ____________ Finish time: ____________  °Date: ____________ Movements: ____________ Start time: ____________ Finish time: ____________ °Date: ____________ Movements: ____________ Start time: ____________ Finish time: ____________ °Date: ____________ Movements: ____________ Start time: ____________ Finish time: ____________ °Date: ____________ Movements: ____________ Start time: ____________ Finish time: ____________ °Date: ____________ Movements: ____________ Start time: ____________ Finish time: ____________ °Date: ____________ Movements: ____________ Start time: ____________ Finish time: ____________ °Date: ____________ Movements: ____________ Start time: ____________ Finish time:   ____________  °Date: ____________ Movements: ____________ Start time: ____________ Finish time: ____________ °Date: ____________ Movements: ____________ Start time: ____________ Finish time: ____________ °Date: ____________ Movements: ____________ Start time: ____________ Finish time: ____________ °Date: ____________ Movements: ____________ Start time: ____________ Finish time: ____________ °Date: ____________ Movements: ____________ Start time: ____________ Finish time: ____________ °Date: ____________ Movements: ____________ Start time: ____________ Finish time: ____________ °Date: ____________ Movements: ____________ Start time: ____________ Finish time: ____________    °Date: ____________ Movements: ____________ Start time: ____________ Finish time: ____________ °Date: ____________ Movements: ____________ Start time: ____________ Finish time: ____________ °Date: ____________ Movements: ____________ Start time: ____________ Finish time: ____________ °Date: ____________ Movements: ____________ Start time: ____________ Finish time: ____________ °Date: ____________ Movements: ____________ Start time: ____________ Finish time: ____________ °Date: ____________ Movements: ____________ Start time: ____________ Finish time: ____________ °Date: ____________ Movements: ____________ Start time: ____________ Finish time: ____________  °Date: ____________ Movements: ____________ Start time: ____________ Finish time: ____________ °Date: ____________ Movements: ____________ Start time: ____________ Finish time: ____________ °Date: ____________ Movements: ____________ Start time: ____________ Finish time: ____________ °Date: ____________ Movements: ____________ Start time: ____________ Finish time: ____________ °Date: ____________ Movements: ____________ Start time: ____________ Finish time: ____________ °Date: ____________ Movements: ____________ Start time: ____________ Finish time: ____________ °Date: ____________ Movements: ____________ Start time: ____________ Finish time: ____________  °Date: ____________ Movements: ____________ Start time: ____________ Finish time: ____________ °Date: ____________ Movements: ____________ Start time: ____________ Finish time: ____________ °Date: ____________ Movements: ____________ Start time: ____________ Finish time: ____________ °Date: ____________ Movements: ____________ Start time: ____________ Finish time: ____________ °Date: ____________ Movements: ____________ Start time: ____________ Finish time: ____________ °Date: ____________ Movements: ____________ Start time: ____________ Finish time: ____________ °Date: ____________ Movements: ____________  Start time: ____________ Finish time: ____________  °Date: ____________ Movements: ____________ Start time: ____________ Finish time: ____________ °Date: ____________ Movements: ____________ Start time: ____________ Finish time: ____________ °Date: ____________ Movements: ____________ Start time: ____________ Finish time: ____________ °Date: ____________ Movements: ____________ Start time: ____________ Finish time: ____________ °Date: ____________ Movements: ____________ Start time: ____________ Finish time: ____________ °Date: ____________ Movements: ____________ Start time: ____________ Finish time: ____________ °Document Released: 02/02/2006 Document Revised: 05/20/2013 Document Reviewed: 10/31/2011 °[employer]® Patient Information ©2015 [employer]. This information is not intended to replace advice given to you by your health care provider. Make sure you discuss any questions you have with your health care provider. ° °

## 2013-10-09 ENCOUNTER — Encounter: Payer: Medicaid Other | Admitting: Family

## 2013-10-16 ENCOUNTER — Ambulatory Visit (INDEPENDENT_AMBULATORY_CARE_PROVIDER_SITE_OTHER): Payer: Medicaid Other | Admitting: Advanced Practice Midwife

## 2013-10-16 VITALS — BP 119/69 | HR 99 | Wt 252.9 lb

## 2013-10-16 DIAGNOSIS — O0930 Supervision of pregnancy with insufficient antenatal care, unspecified trimester: Secondary | ICD-10-CM

## 2013-10-16 DIAGNOSIS — O0933 Supervision of pregnancy with insufficient antenatal care, third trimester: Secondary | ICD-10-CM

## 2013-10-16 LAB — POCT URINALYSIS DIP (DEVICE)
Bilirubin Urine: NEGATIVE
Glucose, UA: NEGATIVE mg/dL
HGB URINE DIPSTICK: NEGATIVE
Ketones, ur: NEGATIVE mg/dL
Nitrite: NEGATIVE
PH: 7 (ref 5.0–8.0)
Protein, ur: NEGATIVE mg/dL
Specific Gravity, Urine: 1.01 (ref 1.005–1.030)
Urobilinogen, UA: 0.2 mg/dL (ref 0.0–1.0)

## 2013-10-16 NOTE — Progress Notes (Signed)
Declined flu vaccine

## 2013-10-16 NOTE — Patient Instructions (Signed)
Braxton Hicks Contractions Contractions of the uterus can occur throughout pregnancy. Contractions are not always a sign that you are in labor.  WHAT ARE BRAXTON HICKS CONTRACTIONS?  Contractions that occur before labor are called Braxton Hicks contractions, or false labor. Toward the end of pregnancy (32-34 weeks), these contractions can develop more often and may become more forceful. This is not true labor because these contractions do not result in opening (dilatation) and thinning of the cervix. They are sometimes difficult to tell apart from true labor because these contractions can be forceful and people have different pain tolerances. You should not feel embarrassed if you go to the hospital with false labor. Sometimes, the only way to tell if you are in true labor is for your health care provider to look for changes in the cervix. If there are no prenatal problems or other health problems associated with the pregnancy, it is completely safe to be sent home with false labor and await the onset of true labor. HOW CAN YOU TELL THE DIFFERENCE BETWEEN TRUE AND FALSE LABOR? False Labor  The contractions of false labor are usually shorter and not as hard as those of true labor.   The contractions are usually irregular.   The contractions are often felt in the front of the lower abdomen and in the groin.   The contractions may go away when you walk around or change positions while lying down.   The contractions get weaker and are shorter lasting as time goes on.   The contractions do not usually become progressively stronger, regular, and closer together as with true labor.  True Labor  Contractions in true labor last 30-70 seconds, become very regular, usually become more intense, and increase in frequency.   The contractions do not go away with walking.   The discomfort is usually felt in the top of the uterus and spreads to the lower abdomen and low back.   True labor can be  determined by your health care provider with an exam. This will show that the cervix is dilating and getting thinner.  WHAT TO REMEMBER  Keep up with your usual exercises and follow other instructions given by your health care provider.   Take medicines as directed by your health care provider.   Keep your regular prenatal appointments.   Eat and drink lightly if you think you are going into labor.   If Braxton Hicks contractions are making you uncomfortable:   Change your position from lying down or resting to walking, or from walking to resting.   Sit and rest in a tub of warm water.   Drink 2-3 glasses of water. Dehydration may cause these contractions.   Do slow and deep breathing several times an hour.  WHEN SHOULD I SEEK IMMEDIATE MEDICAL CARE? Seek immediate medical care if:  Your contractions become stronger, more regular, and closer together.   You have fluid leaking or gushing from your vagina.   You have a fever.   You pass blood-tinged mucus.   You have vaginal bleeding.   You have continuous abdominal pain.   You have low back pain that you never had before.   You feel your baby's head pushing down and causing pelvic pressure.   Your baby is not moving as much as it used to.  Document Released: 01/03/2005 Document Revised: 01/08/2013 Document Reviewed: 10/15/2012 [employer] Patient Information 2015 [employer]. This information is not intended to replace advice given to you by your health care   provider. Make sure you discuss any questions you have with your health care provider.  Influenza Virus Vaccine injection What is this medicine? INFLUENZA VIRUS VACCINE (in floo EN zuh VAHY ruhs vak SEEN) helps to reduce the risk of getting influenza also known as the flu. The vaccine only helps protect you against some strains of the flu. This medicine may be used for other purposes; ask your health care provider or pharmacist if you have  questions. COMMON BRAND NAME(S): Afluria, Agriflu, Fluarix, Fluarix Quadrivalent, FLUCELVAX, Flulaval, Fluvirin, Fluzone, Fluzone High-Dose, Fluzone Intradermal What should I tell my health care provider before I take this medicine? They need to know if you have any of these conditions: -bleeding disorder like hemophilia -fever or infection -Guillain-Barre syndrome or other neurological problems -immune system problems -infection with the human immunodeficiency virus (HIV) or AIDS -low blood platelet counts -multiple sclerosis -an unusual or allergic reaction to influenza virus vaccine, latex, other medicines, foods, dyes, or preservatives. Different brands of vaccines contain different allergens. Some may contain latex or eggs. Talk to your doctor about your allergies to make sure that you get the right vaccine. -pregnant or trying to get pregnant -breast-feeding How should I use this medicine? This vaccine is for injection into a muscle or under the skin. It is given by a health care professional. A copy of Vaccine Information Statements will be given before each vaccination. Read this sheet carefully each time. The sheet may change frequently. Talk to your healthcare provider to see which vaccines are right for you. Some vaccines should not be used in all age groups. Overdosage: If you think you have taken too much of this medicine contact a poison control center or emergency room at once. NOTE: This medicine is only for you. Do not share this medicine with others. What if I miss a dose? This does not apply. What may interact with this medicine? -chemotherapy or radiation therapy -medicines that lower your immune system like etanercept, anakinra, infliximab, and adalimumab -medicines that treat or prevent blood clots like warfarin -phenytoin -steroid medicines like prednisone or cortisone -theophylline -vaccines This list may not describe all possible interactions. Give your health  care provider a list of all the medicines, herbs, non-prescription drugs, or dietary supplements you use. Also tell them if you smoke, drink alcohol, or use illegal drugs. Some items may interact with your medicine. What should I watch for while using this medicine? Report any side effects that do not go away within 3 days to your doctor or health care professional. Call your health care provider if any unusual symptoms occur within 6 weeks of receiving this vaccine. You may still catch the flu, but the illness is not usually as bad. You cannot get the flu from the vaccine. The vaccine will not protect against colds or other illnesses that may cause fever. The vaccine is needed every year. What side effects may I notice from receiving this medicine? Side effects that you should report to your doctor or health care professional as soon as possible: -allergic reactions like skin rash, itching or hives, swelling of the face, lips, or tongue Side effects that usually do not require medical attention (report to your doctor or health care professional if they continue or are bothersome): -fever -headache -muscle aches and pains -pain, tenderness, redness, or swelling at the injection site -tiredness This list may not describe all possible side effects. Call your doctor for medical advice about side effects. You may report side effects to FDA  at 1-800-FDA-1088. Where should I keep my medicine? The vaccine will be given by a health care professional in a clinic, pharmacy, doctor's office, or other health care setting. You will not be given vaccine doses to store at home. NOTE: This sheet is a summary. It may not cover all possible information. If you have questions about this medicine, talk to your doctor, pharmacist, or health care provider.  2015, [employer]. (2011-07-14 16:10:96)

## 2013-10-31 ENCOUNTER — Other Ambulatory Visit: Payer: Self-pay | Admitting: Obstetrics and Gynecology

## 2013-10-31 ENCOUNTER — Ambulatory Visit (INDEPENDENT_AMBULATORY_CARE_PROVIDER_SITE_OTHER): Payer: Self-pay | Admitting: Obstetrics and Gynecology

## 2013-10-31 VITALS — BP 127/69 | HR 88 | Wt 255.8 lb

## 2013-10-31 DIAGNOSIS — O0933 Supervision of pregnancy with insufficient antenatal care, third trimester: Secondary | ICD-10-CM

## 2013-10-31 DIAGNOSIS — E669 Obesity, unspecified: Secondary | ICD-10-CM | POA: Insufficient documentation

## 2013-10-31 LAB — POCT URINALYSIS DIP (DEVICE)
Bilirubin Urine: NEGATIVE
Glucose, UA: NEGATIVE mg/dL
Ketones, ur: NEGATIVE mg/dL
Nitrite: NEGATIVE
PH: 7 (ref 5.0–8.0)
Protein, ur: NEGATIVE mg/dL
Specific Gravity, Urine: 1.02 (ref 1.005–1.030)
UROBILINOGEN UA: 0.2 mg/dL (ref 0.0–1.0)

## 2013-10-31 LAB — OB RESULTS CONSOLE GC/CHLAMYDIA
Chlamydia: NEGATIVE
Gonorrhea: NEGATIVE

## 2013-10-31 LAB — OB RESULTS CONSOLE GBS: GBS: POSITIVE

## 2013-10-31 NOTE — Addendum Note (Signed)
Addended by: Kathee DeltonHILLMAN, Suella Cogar L on: 10/31/2013 03:55 PM   Modules accepted: Orders

## 2013-10-31 NOTE — Progress Notes (Signed)
Doing well. No concerns. Still working. Plans reviewed. Discussed signs of labor, coping in labor.

## 2013-10-31 NOTE — Patient Instructions (Signed)

## 2013-11-01 LAB — GC/CHLAMYDIA PROBE AMP
CT Probe RNA: NEGATIVE
GC Probe RNA: NEGATIVE

## 2013-11-02 LAB — CULTURE, BETA STREP (GROUP B ONLY)

## 2013-11-05 ENCOUNTER — Ambulatory Visit (INDEPENDENT_AMBULATORY_CARE_PROVIDER_SITE_OTHER): Payer: Medicaid Other | Admitting: Obstetrics & Gynecology

## 2013-11-05 ENCOUNTER — Encounter: Payer: Self-pay | Admitting: Obstetrics & Gynecology

## 2013-11-05 VITALS — BP 139/70 | HR 73 | Temp 98.3°F | Wt 260.5 lb

## 2013-11-05 DIAGNOSIS — O99820 Streptococcus B carrier state complicating pregnancy: Secondary | ICD-10-CM

## 2013-11-05 DIAGNOSIS — R8290 Unspecified abnormal findings in urine: Secondary | ICD-10-CM

## 2013-11-05 DIAGNOSIS — O0933 Supervision of pregnancy with insufficient antenatal care, third trimester: Secondary | ICD-10-CM

## 2013-11-05 DIAGNOSIS — K5900 Constipation, unspecified: Secondary | ICD-10-CM

## 2013-11-05 DIAGNOSIS — Z22330 Carrier of Group B streptococcus: Secondary | ICD-10-CM

## 2013-11-05 DIAGNOSIS — O99613 Diseases of the digestive system complicating pregnancy, third trimester: Secondary | ICD-10-CM

## 2013-11-05 LAB — POCT URINALYSIS DIP (DEVICE)
Bilirubin Urine: NEGATIVE
Glucose, UA: NEGATIVE mg/dL
Nitrite: NEGATIVE
PROTEIN: 30 mg/dL — AB
Specific Gravity, Urine: 1.025 (ref 1.005–1.030)
UROBILINOGEN UA: 1 mg/dL (ref 0.0–1.0)
pH: 6.5 (ref 5.0–8.0)

## 2013-11-05 MED ORDER — DOCUSATE SODIUM 100 MG PO CAPS: 100.0000 mg | ORAL_CAPSULE | Freq: Two times a day (BID) | ORAL | Status: DC | PRN

## 2013-11-05 NOTE — Patient Instructions (Addendum)
Return to clinic for any obstetric concerns or go to MAU for evaluation Group B Streptococcus Infection During Pregnancy Group B streptococcus (GBS) is a type of bacteria often found in healthy women. GBS is not the same as the bacteria that causes strep throat. You may have GBS in your vagina, rectum, or bladder. GBS does not spread through sexual contact, but it can be passed to a baby during childbirth. This can be dangerous for your baby. It is not dangerous to you and usually does not cause any symptoms. Your health care provider may test you for GBS when your pregnancy is between 35 and 37 weeks. GBS is dangerous only during birth, so there is no need to test for it earlier. It is possible to have GBS during pregnancy and never pass it to your baby. If your test results are positive for GBS, your health care provider may recommend giving you antibiotic medicine during delivery to make sure your baby stays healthy. RISK FACTORS You are more likely to pass GBS to your baby if:   Your water breaks (ruptured membrane) or you go into labor before 37 weeks.  Your water breaks 18 hours before you deliver.  You passed GBS during a previous pregnancy.  You have a urinary tract infection caused by GBS any time during pregnancy.  You have a fever during labor. SYMPTOMS Most women who have GBS do not have any symptoms. If you have a urinary tract infection caused by GBS, you might have frequent or painful urination and fever. Babies who get GBS usually show symptoms within 7 days of birth. Symptoms may include:   Breathing problems.  Heart and blood pressure problems.  Digestive and kidney problems. DIAGNOSIS Routine screening for GBS is recommended for all pregnant women. A health care provider takes a sample of the fluid in your vagina and rectum with a swab. It is then sent to a lab to be checked for GBS. A sample of your urine may also be checked for the bacteria.  TREATMENT If you test  positive for GBS, you may need treatment with an antibiotic medicine during labor. As soon as you go into labor, or as soon as your membranes rupture, you will get the antibiotic medicine through an IV access. You will continue to get the medicine until after you give birth. You do not need antibiotic medicine if you are having a cesarean delivery.If your baby shows signs or symptoms of GBS after birth, your baby can also be treated with an antibiotic medicine. HOME CARE INSTRUCTIONS   Take all antibiotic medicine as prescribed by your health care provider. Only take medicine as directed.   Continue with prenatal visits and care.   Keep all follow-up appointments.  SEEK MEDICAL CARE IF:   You have pain when you urinate.   You have to urinate frequently.   You have a fever.  SEEK IMMEDIATE MEDICAL CARE IF:   Your membranes rupture.  You go into labor. Document Released: 04/12/2007 Document Revised: 01/08/2013 Document Reviewed: 10/26/2012 [facility] Patient Information 2015 [employer], [place]. This information is not intended to replace advice given to you by your health care provider. Make sure you discuss any questions you have with your health care provider.

## 2013-11-05 NOTE — Progress Notes (Signed)
Reports edema in hands, feet and face.  C/o of occasional pelvic pressure.  Patient reports receiving Tdap in right deltoid on 09/18/13-- no administration documentation.

## 2013-11-05 NOTE — Progress Notes (Signed)
Reassured about symptoms. Labor and fetal movement precautions reviewed. GBS positive results discussed, emphasized need for treatment in labor.

## 2013-11-07 LAB — CULTURE, OB URINE

## 2013-11-13 ENCOUNTER — Ambulatory Visit (INDEPENDENT_AMBULATORY_CARE_PROVIDER_SITE_OTHER): Payer: Medicaid Other | Admitting: Obstetrics and Gynecology

## 2013-11-13 ENCOUNTER — Encounter: Payer: Self-pay | Admitting: Obstetrics and Gynecology

## 2013-11-13 VITALS — BP 133/67 | HR 74 | Temp 98.2°F | Wt 257.9 lb

## 2013-11-13 DIAGNOSIS — O0933 Supervision of pregnancy with insufficient antenatal care, third trimester: Secondary | ICD-10-CM

## 2013-11-13 LAB — POCT URINALYSIS DIP (DEVICE)
BILIRUBIN URINE: NEGATIVE
GLUCOSE, UA: NEGATIVE mg/dL
HGB URINE DIPSTICK: NEGATIVE
Ketones, ur: NEGATIVE mg/dL
Nitrite: NEGATIVE
Protein, ur: NEGATIVE mg/dL
SPECIFIC GRAVITY, URINE: 1.01 (ref 1.005–1.030)
Urobilinogen, UA: 0.2 mg/dL (ref 0.0–1.0)
pH: 7 (ref 5.0–8.0)

## 2013-11-13 NOTE — Progress Notes (Signed)
Doing well. Plans to breastfeed and use Nexplanon. Labor precautions. Breastpump Rx.

## 2013-11-13 NOTE — Patient Instructions (Signed)

## 2013-11-18 ENCOUNTER — Encounter: Payer: Self-pay | Admitting: Obstetrics and Gynecology

## 2013-11-20 ENCOUNTER — Encounter: Payer: Self-pay | Admitting: Advanced Practice Midwife

## 2013-11-20 ENCOUNTER — Ambulatory Visit (INDEPENDENT_AMBULATORY_CARE_PROVIDER_SITE_OTHER): Payer: Medicaid Other | Admitting: Advanced Practice Midwife

## 2013-11-20 VITALS — BP 137/80 | HR 86 | Temp 98.8°F | Wt 257.9 lb

## 2013-11-20 DIAGNOSIS — O368131 Decreased fetal movements, third trimester, fetus 1: Secondary | ICD-10-CM

## 2013-11-20 LAB — POCT URINALYSIS DIP (DEVICE)
BILIRUBIN URINE: NEGATIVE
Glucose, UA: NEGATIVE mg/dL
HGB URINE DIPSTICK: NEGATIVE
Ketones, ur: NEGATIVE mg/dL
Nitrite: NEGATIVE
PH: 7.5 (ref 5.0–8.0)
Protein, ur: NEGATIVE mg/dL
Specific Gravity, Urine: 1.02 (ref 1.005–1.030)
Urobilinogen, UA: 0.2 mg/dL (ref 0.0–1.0)

## 2013-11-20 NOTE — Progress Notes (Signed)
Patient reports pelvic pressure/pain 

## 2013-11-20 NOTE — Progress Notes (Signed)
Discussed signs of labor. Has had some blood tinged mucous. States baby only moved twice in last 2 days. Will get NST  >>  Reactive with mild frequent contractions.

## 2013-11-20 NOTE — Patient Instructions (Signed)

## 2013-11-24 ENCOUNTER — Encounter (HOSPITAL_COMMUNITY): Payer: Self-pay | Admitting: *Deleted

## 2013-11-24 ENCOUNTER — Inpatient Hospital Stay (HOSPITAL_COMMUNITY)
Admission: AD | Admit: 2013-11-24 | Discharge: 2013-11-24 | Disposition: A | Discharge status: Home / Self Care | Payer: Medicaid Other | Source: Ambulatory Visit | Attending: Family Medicine | Admitting: Family Medicine

## 2013-11-24 DIAGNOSIS — Z3A40 40 weeks gestation of pregnancy: Secondary | ICD-10-CM | POA: Diagnosis not present

## 2013-11-24 DIAGNOSIS — O99820 Streptococcus B carrier state complicating pregnancy: Secondary | ICD-10-CM

## 2013-11-24 DIAGNOSIS — O9989 Other specified diseases and conditions complicating pregnancy, childbirth and the puerperium: Secondary | ICD-10-CM | POA: Diagnosis present

## 2013-11-24 DIAGNOSIS — O0933 Supervision of pregnancy with insufficient antenatal care, third trimester: Secondary | ICD-10-CM

## 2013-11-24 LAB — POCT FERN TEST: POCT Fern Test: NEGATIVE

## 2013-11-24 LAB — AMNISURE RUPTURE OF MEMBRANE (ROM) NOT AT ARMC: AMNISURE: NEGATIVE

## 2013-11-24 NOTE — MAU Note (Signed)
Patient presents with complaint of leaking fluid since 0730 today.

## 2013-11-24 NOTE — Discharge Instructions (Signed)

## 2013-11-26 ENCOUNTER — Ambulatory Visit (INDEPENDENT_AMBULATORY_CARE_PROVIDER_SITE_OTHER): Payer: Medicaid Other | Admitting: Advanced Practice Midwife

## 2013-11-26 VITALS — BP 134/70 | HR 95 | Temp 99.2°F | Wt 257.9 lb

## 2013-11-26 DIAGNOSIS — O480 Post-term pregnancy: Secondary | ICD-10-CM

## 2013-11-26 LAB — POCT URINALYSIS DIP (DEVICE)
BILIRUBIN URINE: NEGATIVE
Glucose, UA: NEGATIVE mg/dL
Hgb urine dipstick: NEGATIVE
Ketones, ur: NEGATIVE mg/dL
NITRITE: NEGATIVE
PH: 7 (ref 5.0–8.0)
Protein, ur: NEGATIVE mg/dL
Specific Gravity, Urine: 1.01 (ref 1.005–1.030)
Urobilinogen, UA: 0.2 mg/dL (ref 0.0–1.0)

## 2013-11-26 NOTE — Progress Notes (Signed)
Fetal testing initiated due to postdates. Pt desires IOL @ 41 wks - scheduled 11/15 @ 1930

## 2013-11-26 NOTE — Progress Notes (Signed)
Doing well.  Good fetal movement, denies vaginal bleeding, LOF, regular contractions. Reports irregular contractions continuing daily, no change since Sunday.  Desires cervical exam/membranes swept.  Reviewed signs of labor/reasons to come to hospital.

## 2013-11-26 NOTE — Progress Notes (Signed)
Came to MAU 11/24/13 because she thought water leaking, after exam had a little bloody spotting.

## 2013-11-27 ENCOUNTER — Inpatient Hospital Stay (HOSPITAL_COMMUNITY): Payer: Medicaid Other | Admitting: Anesthesiology

## 2013-11-27 ENCOUNTER — Encounter (HOSPITAL_COMMUNITY): Payer: Self-pay | Admitting: Anesthesiology

## 2013-11-27 ENCOUNTER — Inpatient Hospital Stay (HOSPITAL_COMMUNITY)
Admission: AD | Admit: 2013-11-27 | Discharge: 2013-11-29 | DRG: 775 | Disposition: A | Discharge status: Home / Self Care | Payer: Medicaid Other | Source: Ambulatory Visit | Attending: Obstetrics and Gynecology | Admitting: Obstetrics and Gynecology

## 2013-11-27 ENCOUNTER — Encounter (HOSPITAL_COMMUNITY): Payer: Self-pay | Admitting: *Deleted

## 2013-11-27 DIAGNOSIS — O99824 Streptococcus B carrier state complicating childbirth: Secondary | ICD-10-CM | POA: Diagnosis present

## 2013-11-27 DIAGNOSIS — O99820 Streptococcus B carrier state complicating pregnancy: Secondary | ICD-10-CM

## 2013-11-27 DIAGNOSIS — Z8249 Family history of ischemic heart disease and other diseases of the circulatory system: Secondary | ICD-10-CM | POA: Diagnosis not present

## 2013-11-27 DIAGNOSIS — IMO0001 Reserved for inherently not codable concepts without codable children: Secondary | ICD-10-CM

## 2013-11-27 DIAGNOSIS — Z3A40 40 weeks gestation of pregnancy: Secondary | ICD-10-CM | POA: Diagnosis present

## 2013-11-27 DIAGNOSIS — O9989 Other specified diseases and conditions complicating pregnancy, childbirth and the puerperium: Secondary | ICD-10-CM | POA: Diagnosis present

## 2013-11-27 DIAGNOSIS — O99214 Obesity complicating childbirth: Secondary | ICD-10-CM | POA: Diagnosis present

## 2013-11-27 DIAGNOSIS — O0933 Supervision of pregnancy with insufficient antenatal care, third trimester: Secondary | ICD-10-CM

## 2013-11-27 LAB — TYPE AND SCREEN
ABO/RH(D): O POS
Antibody Screen: NEGATIVE

## 2013-11-27 LAB — CBC
HCT: 40.8 % (ref 36.0–46.0)
HEMOGLOBIN: 13.9 g/dL (ref 12.0–15.0)
MCH: 30.5 pg (ref 26.0–34.0)
MCHC: 34.1 g/dL (ref 30.0–36.0)
MCV: 89.7 fL (ref 78.0–100.0)
Platelets: 186 10*3/uL (ref 150–400)
RBC: 4.55 MIL/uL (ref 3.87–5.11)
RDW: 14.2 % (ref 11.5–15.5)
WBC: 16.9 10*3/uL — ABNORMAL HIGH (ref 4.0–10.5)

## 2013-11-27 LAB — POCT FERN TEST: POCT FERN TEST: NEGATIVE

## 2013-11-27 LAB — ABO/RH: ABO/RH(D): O POS

## 2013-11-27 LAB — RPR

## 2013-11-27 MED ORDER — OXYCODONE-ACETAMINOPHEN 5-325 MG PO TABS
2.0000 | ORAL_TABLET | ORAL | Status: DC | PRN
  Administered 2013-11-28: 2 via ORAL
  Filled 2013-11-27: qty 2

## 2013-11-27 MED ORDER — LACTATED RINGERS IV SOLN
INTRAVENOUS | Status: DC
  Administered 2013-11-27 (×2): via INTRAVENOUS

## 2013-11-27 MED ORDER — FLEET ENEMA 7-19 GM/118ML RE ENEM: 1.0000 | ENEMA | RECTAL | Status: DC | PRN

## 2013-11-27 MED ORDER — FENTANYL 2.5 MCG/ML BUPIVACAINE 1/10 % EPIDURAL INFUSION (WH - ANES)
14.0000 mL/h | INTRAMUSCULAR | Status: DC | PRN
  Administered 2013-11-27 (×2): 14 mL/h via EPIDURAL
  Filled 2013-11-27 (×2): qty 125

## 2013-11-27 MED ORDER — DEXTROSE 5 % IV SOLN
5.0000 10*6.[IU] | Freq: Once | INTRAVENOUS | Status: AC
  Administered 2013-11-27: 5 10*6.[IU] via INTRAVENOUS
  Filled 2013-11-27: qty 5

## 2013-11-27 MED ORDER — BENZOCAINE-MENTHOL 20-0.5 % EX AERO
1.0000 "application " | INHALATION_SPRAY | CUTANEOUS | Status: DC | PRN
  Administered 2013-11-27: 1 via TOPICAL
  Filled 2013-11-27: qty 56

## 2013-11-27 MED ORDER — ONDANSETRON HCL 4 MG/2ML IJ SOLN
4.0000 mg | Freq: Four times a day (QID) | INTRAMUSCULAR | Status: DC | PRN
Start: 2013-11-27 — End: 2013-11-27
  Administered 2013-11-27: 4 mg via INTRAVENOUS
  Filled 2013-11-27: qty 2

## 2013-11-27 MED ORDER — LANOLIN HYDROUS EX OINT: TOPICAL_OINTMENT | CUTANEOUS | Status: DC | PRN

## 2013-11-27 MED ORDER — PHENYLEPHRINE 40 MCG/ML (10ML) SYRINGE FOR IV PUSH (FOR BLOOD PRESSURE SUPPORT)
80.0000 ug | PREFILLED_SYRINGE | INTRAVENOUS | Status: DC | PRN
Start: 2013-11-27 — End: 2013-11-27
  Filled 2013-11-27: qty 2

## 2013-11-27 MED ORDER — OXYTOCIN BOLUS FROM INFUSION: 500.0000 mL | INTRAVENOUS | Status: DC

## 2013-11-27 MED ORDER — PHENYLEPHRINE 40 MCG/ML (10ML) SYRINGE FOR IV PUSH (FOR BLOOD PRESSURE SUPPORT)
80.0000 ug | PREFILLED_SYRINGE | INTRAVENOUS | Status: DC | PRN
  Filled 2013-11-27: qty 10
  Filled 2013-11-27: qty 2

## 2013-11-27 MED ORDER — OXYTOCIN 40 UNITS IN LACTATED RINGERS INFUSION - SIMPLE MED
1.0000 m[IU]/min | INTRAVENOUS | Status: DC
  Administered 2013-11-27: 666 m[IU]/min via INTRAVENOUS
  Administered 2013-11-27: 2 m[IU]/min via INTRAVENOUS

## 2013-11-27 MED ORDER — ONDANSETRON HCL 4 MG PO TABS: 4.0000 mg | ORAL_TABLET | ORAL | Status: DC | PRN

## 2013-11-27 MED ORDER — LACTATED RINGERS IV SOLN
500.0000 mL | Freq: Once | INTRAVENOUS | Status: AC
  Administered 2013-11-27: 500 mL via INTRAVENOUS

## 2013-11-27 MED ORDER — ONDANSETRON HCL 4 MG/2ML IJ SOLN: 4.0000 mg | INTRAMUSCULAR | Status: DC | PRN

## 2013-11-27 MED ORDER — EPHEDRINE 5 MG/ML INJ
10.0000 mg | INTRAVENOUS | Status: DC | PRN
Start: 2013-11-27 — End: 2013-11-27
  Filled 2013-11-27: qty 2

## 2013-11-27 MED ORDER — LACTATED RINGERS IV SOLN: 500.0000 mL | INTRAVENOUS | Status: DC | PRN

## 2013-11-27 MED ORDER — DIPHENHYDRAMINE HCL 25 MG PO CAPS: 25.0000 mg | ORAL_CAPSULE | Freq: Four times a day (QID) | ORAL | Status: DC | PRN

## 2013-11-27 MED ORDER — TERBUTALINE SULFATE 1 MG/ML IJ SOLN: 0.2500 mg | Freq: Once | INTRAMUSCULAR | Status: DC | PRN

## 2013-11-27 MED ORDER — OXYCODONE-ACETAMINOPHEN 5-325 MG PO TABS: 1.0000 | ORAL_TABLET | ORAL | Status: DC | PRN

## 2013-11-27 MED ORDER — LIDOCAINE HCL (PF) 1 % IJ SOLN
30.0000 mL | INTRAMUSCULAR | Status: AC | PRN
  Administered 2013-11-27: 30 mL via SUBCUTANEOUS
  Filled 2013-11-27: qty 30

## 2013-11-27 MED ORDER — TETANUS-DIPHTH-ACELL PERTUSSIS 5-2.5-18.5 LF-MCG/0.5 IM SUSP: 0.5000 mL | Freq: Once | INTRAMUSCULAR | Status: DC

## 2013-11-27 MED ORDER — CITRIC ACID-SODIUM CITRATE 334-500 MG/5ML PO SOLN: 30.0000 mL | ORAL | Status: DC | PRN

## 2013-11-27 MED ORDER — EPHEDRINE 5 MG/ML INJ
10.0000 mg | INTRAVENOUS | Status: DC | PRN
  Filled 2013-11-27: qty 2

## 2013-11-27 MED ORDER — DIPHENHYDRAMINE HCL 50 MG/ML IJ SOLN: 12.5000 mg | INTRAMUSCULAR | Status: DC | PRN

## 2013-11-27 MED ORDER — SIMETHICONE 80 MG PO CHEW: 80.0000 mg | CHEWABLE_TABLET | ORAL | Status: DC | PRN

## 2013-11-27 MED ORDER — LIDOCAINE HCL (PF) 1 % IJ SOLN
INTRAMUSCULAR | Status: DC | PRN
  Administered 2013-11-27 (×2): 4 mL

## 2013-11-27 MED ORDER — DIBUCAINE 1 % RE OINT
1.0000 "application " | TOPICAL_OINTMENT | RECTAL | Status: DC | PRN
  Administered 2013-11-27: 1 via RECTAL
  Filled 2013-11-27: qty 28

## 2013-11-27 MED ORDER — SENNOSIDES-DOCUSATE SODIUM 8.6-50 MG PO TABS
2.0000 | ORAL_TABLET | ORAL | Status: DC
  Administered 2013-11-28 (×2): 2 via ORAL
  Filled 2013-11-27 (×2): qty 2

## 2013-11-27 MED ORDER — PRENATAL MULTIVITAMIN CH
1.0000 | ORAL_TABLET | Freq: Every day | ORAL | Status: DC
Start: 2013-11-28 — End: 2013-11-29
  Administered 2013-11-28: 1 via ORAL
  Filled 2013-11-27: qty 1

## 2013-11-27 MED ORDER — OXYCODONE-ACETAMINOPHEN 5-325 MG PO TABS: 2.0000 | ORAL_TABLET | ORAL | Status: DC | PRN

## 2013-11-27 MED ORDER — FENTANYL 2.5 MCG/ML BUPIVACAINE 1/10 % EPIDURAL INFUSION (WH - ANES)
INTRAMUSCULAR | Status: DC | PRN
  Administered 2013-11-27: 14 mL/h via EPIDURAL

## 2013-11-27 MED ORDER — OXYTOCIN 40 UNITS IN LACTATED RINGERS INFUSION - SIMPLE MED
62.5000 mL/h | INTRAVENOUS | Status: DC
  Filled 2013-11-27: qty 1000

## 2013-11-27 MED ORDER — OXYCODONE-ACETAMINOPHEN 5-325 MG PO TABS
1.0000 | ORAL_TABLET | ORAL | Status: DC | PRN
  Administered 2013-11-28 – 2013-11-29 (×4): 1 via ORAL
  Filled 2013-11-27 (×4): qty 1

## 2013-11-27 MED ORDER — ACETAMINOPHEN 325 MG PO TABS: 650.0000 mg | ORAL_TABLET | ORAL | Status: DC | PRN

## 2013-11-27 MED ORDER — IBUPROFEN 600 MG PO TABS
600.0000 mg | ORAL_TABLET | Freq: Four times a day (QID) | ORAL | Status: DC
  Administered 2013-11-28 – 2013-11-29 (×6): 600 mg via ORAL
  Filled 2013-11-27 (×6): qty 1

## 2013-11-27 MED ORDER — ZOLPIDEM TARTRATE 5 MG PO TABS: 5.0000 mg | ORAL_TABLET | Freq: Every evening | ORAL | Status: DC | PRN

## 2013-11-27 MED ORDER — FENTANYL CITRATE 0.05 MG/ML IJ SOLN
100.0000 ug | INTRAMUSCULAR | Status: DC | PRN
  Administered 2013-11-27: 100 ug via INTRAVENOUS

## 2013-11-27 MED ORDER — DEXTROSE 5 % IV SOLN
2.5000 10*6.[IU] | INTRAVENOUS | Status: DC
  Administered 2013-11-27 (×2): 2.5 10*6.[IU] via INTRAVENOUS
  Filled 2013-11-27 (×6): qty 2.5

## 2013-11-27 MED ORDER — WITCH HAZEL-GLYCERIN EX PADS
1.0000 "application " | MEDICATED_PAD | CUTANEOUS | Status: DC | PRN
  Administered 2013-11-27: 1 via TOPICAL

## 2013-11-27 MED ORDER — FENTANYL CITRATE 0.05 MG/ML IJ SOLN
INTRAMUSCULAR | Status: AC
  Filled 2013-11-27: qty 2

## 2013-11-27 NOTE — Anesthesia Preprocedure Evaluation (Signed)
Anesthesia Evaluation  Patient identified by MRN, date of birth, ID band Patient awake    Reviewed: Allergy & Precautions, H&P , Patient's Chart, lab work & pertinent test results  Airway Mallampati: III  TM Distance: >3 FB Neck ROM: Full    Dental no notable dental hx. (+) Teeth Intact   Pulmonary neg pulmonary ROS,  breath sounds clear to auscultation  Pulmonary exam normal       Cardiovascular negative cardio ROS  Rhythm:Regular Rate:Normal     Neuro/Psych negative neurological ROS  negative psych ROS   GI/Hepatic Neg liver ROS, GERD-  ,  Endo/Other  Morbid obesity  Renal/GU negative Renal ROS  negative genitourinary   Musculoskeletal negative musculoskeletal ROS (+)   Abdominal (+) + obese,   Peds  Hematology negative hematology ROS (+)   Anesthesia Other Findings   Reproductive/Obstetrics (+) Pregnancy                             Anesthesia Physical Anesthesia Plan  ASA: III  Anesthesia Plan: Epidural   Post-op Pain Management:    Induction:   Airway Management Planned: Natural Airway  Additional Equipment:   Intra-op Plan:   Post-operative Plan:   Informed Consent: I have reviewed the patients History and Physical, chart, labs and discussed the procedure including the risks, benefits and alternatives for the proposed anesthesia with the patient or authorized representative who has indicated his/her understanding and acceptance.     Plan Discussed with: Anesthesiologist  Anesthesia Plan Comments:         Anesthesia Quick Evaluation

## 2013-11-27 NOTE — MAU Note (Signed)
Report given to [facility] [place], RN  In BS. Strip reviewed. May transfer to room 162.

## 2013-11-27 NOTE — MAU Note (Signed)
Pt in room, BS nurse in room. Will apply monitors

## 2013-11-27 NOTE — Plan of Care (Signed)
Problem: Consults Goal: Postpartum Patient Education (See Patient Education module for education specifics.)  Outcome: Progressing  Problem: Phase I Progression Outcomes Goal: Initial discharge plan identified Outcome: Completed/Met Date Met:  11/27/13

## 2013-11-27 NOTE — H&P (Signed)
Sheryl Pitts is a 18 y.o. female G1P0 with IUP at 4863w3d presenting with ROM. Patient had large gush of fluid around 1:20am and has continued to leak fluid. She also reports contractions starting around the same time as ROM.  Some scant staining of the fluid, no bright red blood. Endorses active fetal movement.   [facility]  Prenatal History/Complications:  Clinic  LR  Dating  LMP, consistent with 11 wk US  Genetic Screen Declined  Anatomic US  Nml @ 19 wks, limited views > rescan in 6 wks. Complete. RVEIF  GTT Early:  115          Third trimester:   TDaP vaccine  patient reports receiving 09/18/13 in right deltoid; no administration documentation   Flu vaccine  Declined  GBS   Contraception  Nexplanon  Baby Food  Breast  Circumcision NA  Pediatrician [facility]  Support Person  Sheryl Pitts - FOB     Past Medical History: Past Medical History  Diagnosis Date  . Medical history non-contributory     Past Surgical History: Past Surgical History  Procedure Laterality Date  . Wisdom tooth extraction      Obstetrical History: OB History    Gravida Para Term Preterm AB TAB SAB Ectopic Multiple Living   1               Social History: History   Social History  . Marital Status: Single    Spouse Name: N/A    Number of Children: N/A  . Years of Education: N/A   Social History Main Topics  . Smoking status: Never Smoker   . Smokeless tobacco: Never Used  . Alcohol Use: No  . Drug Use: No  . Sexual Activity: Yes    Birth Control/ Protection: None   Other Topics Concern  . None   Social History Narrative    Family History: Family History  Problem Relation Age of Onset  . Hypertension Father   . Diabetes Neg Hx     Allergies: No Known Allergies  Prescriptions prior to admission  Medication Sig Dispense Refill Last Dose  . docusate sodium (COLACE) 100 MG capsule Take 1 capsule (100 mg total) by mouth 2 (two) times daily as needed. 30 capsule  2 Taking  . prenatal vitamin w/FE, FA (PRENATAL 1 + 1) 27-1 MG TABS tablet Take 1 tablet by mouth daily at 12 noon. 30 each 12 Taking     Review of Systems   Constitutional: No fevers or chills.   Last menstrual period 02/17/2013. General appearance: alert, cooperative and appearing in pain with contractions Lungs: clear to auscultation bilaterally Heart: regular rate and rhythm Abdomen: soft, non-tender; bowel sounds normal Pelvic: adequate Extremities: Homans sign is negative, no sign of DVT DTR's wnl Presentation: cephalic Fetal monitoringBaseline: 140 bpm, Variability: Good {> 6 bpm), Accelerations: Reactive and Decelerations: Absent Uterine activityFrequency: Every 2-4 minutes SVE: +pooling Dilation: 3.5 Effacement (%): 50 Station: -3 Exam by:: Judie Petitunbar RN    Prenatal labs: ABO, Rh: O/POS/-- (06/10 1117) Antibody: NEG (06/10 1117) Rubella:   RPR: NON REAC (08/19 1418)  HBsAg: NEGATIVE (06/10 1117)  HIV: NONREACTIVE (08/19 1418)  GBS: Positive (10/15 0000)  1 hr Glucola 115 Genetic screening  Declined Anatomy US RVEIF   Prenatal Transfer Tool  Maternal Diabetes: No Genetic Screening: Declined Maternal Ultrasounds/Referrals: Abnormal:  Findings:   Isolated EIF (echogenic intracardiac focus) Fetal Ultrasounds or other Referrals:  None Maternal Substance Abuse:  No Significant  Maternal Medications:  None Significant Maternal Lab Results: Lab values include: Group B Strep positive     Results for orders placed or performed during the hospital encounter of 11/27/13 (from the past 24 hour(s))  Fern Test   Collection Time: 11/27/13  2:21 AM  Result Value Ref Range   POCT Fern Test Negative = intact amniotic membranes   Results for orders placed or performed in visit on 11/26/13 (from the past 24 hour(s))  POCT urinalysis dip (device)   Collection Time: 11/26/13 10:15 AM  Result Value Ref Range   Glucose, UA NEGATIVE NEGATIVE mg/dL   Bilirubin Urine NEGATIVE  NEGATIVE   Ketones, ur NEGATIVE NEGATIVE mg/dL   Specific Gravity, Urine 1.010 1.005 - 1.030   Hgb urine dipstick NEGATIVE NEGATIVE   pH 7.0 5.0 - 8.0   Protein, ur NEGATIVE NEGATIVE mg/dL   Urobilinogen, UA 0.2 0.0 - 1.0 mg/dL   Nitrite NEGATIVE NEGATIVE   Leukocytes, UA SMALL (A) NEGATIVE    Assessment: Sheryl Pitts is a 18 y.o. G1P0 at 5574w3d here with active labor and ROM #Labor:SOL, will augment with pitocin per protocol #Pain: Analgesia and anesthesia prn #FWB: Category 1 #ID:  GBS positive, will give PCN prophylaxis in active labor #MOF: breast #MOC:nexplanon #Circ:  N/a, female baby  Sheryl Pitts, Sheryl Pitts 11/27/2013, 3:24 AM  OB fellow attestation:  I have seen and examined this patient; I agree with above documentation in the resident's note.   Sheryl Pitts is a 18 y.o. G1P0 reporting loss of fluid +FM, denies LOF, VB, contractions, vaginal discharge.  PE: BP 141/54 mmHg  Pulse 74  Temp(Src) 98.3 F (36.8 C) (Oral)  Resp 18  Ht 5\' 5"  (1.651 m)  Wt 257 lb (116.574 kg)  BMI 42.77 kg/m2  SpO2 98%  LMP 02/17/2013 Gen: calm comfortable, NAD Resp: normal effort, no distress Abd: gravid  ROS, labs, PMH reviewed Dilation: 3.5 Effacement (%): 50 Cervical Position: Middle Station: -3 Presentation: Vertex Exam by:: Judie Petitunbar RN    Plan: - admit to birthing suites - pitocin for augmentation, cervix favorable given exam and contracting too much for cytotec  Sheryl Pitts,Sheryl Pitts ROCIO, MD 7:53 AM

## 2013-11-27 NOTE — Anesthesia Procedure Notes (Signed)
Epidural Patient location during procedure: OB Start time: 11/27/2013 5:27 AM  Staffing Anesthesiologist: Malen GauzeFOSTER, Quenna Doepke A.  Preanesthetic Checklist Completed: patient identified, site marked, surgical consent, pre-op evaluation, timeout performed, IV checked, risks and benefits discussed and monitors and equipment checked  Epidural Patient position: sitting Prep: site prepped and draped and DuraPrep Patient monitoring: continuous pulse ox and blood pressure Approach: midline Location: L4-L5 Injection technique: LOR air  Needle:  Needle type: Tuohy  Needle gauge: 17 G Needle length: 9 cm and 9 Needle insertion depth: 9 cm Catheter type: closed end flexible Catheter size: 19 Gauge Catheter at skin depth: 14 cm Test dose: negative and Other  Assessment Events: blood not aspirated, injection not painful, no injection resistance, negative IV test and no paresthesia  Additional Notes Patient identified. Risks and benefits discussed including failed block, incomplete  Pain control, post dural puncture headache, nerve damage, paralysis, blood pressure Changes, nausea, vomiting, reactions to medications-both toxic and allergic and post Partum back pain. All questions were answered. Patient expressed understanding and wished to proceed. Sterile technique was used throughout procedure. Epidural site was Dressed with sterile barrier dressing. No paresthesias, signs of intravascular injection Or signs of intrathecal spread were encountered.  Patient was more comfortable after the epidural was dosed. Please see RN's note for documentation of vital signs and FHR which are stable.

## 2013-11-27 NOTE — Plan of Care (Signed)
Problem: Phase I Progression Outcomes Goal: OOB as tolerated unless otherwise ordered Outcome: Completed/Met Date Met:  11/27/13     

## 2013-11-27 NOTE — MAU Note (Signed)
Pt presents with c/o contractions and SROM @ 0120 this morning. Positive vaginal bleeding in discharge, denies flow of bright red blood. Positive fetal movement last at 2200 last night. Denies any risk factors in pregnancy.

## 2013-11-27 NOTE — Progress Notes (Signed)
Patient ID: Sheryl Pitts, female   DOB: 06/08/1995, 18 y.o.   MRN: [id number]  Doing well Filed Vitals:   11/27/13 1541 11/27/13 1546 11/27/13 1551 11/27/13 1556  BP:      Pulse: 79 82 71   Temp:      TempSrc:      Resp:    18  Height:      Weight:      SpO2:        Comfortable with epidural  Feels some pressure FHR reassuring UCs every 2-3 min   Dilation: 10 Dilation Complete Date: 11/27/13 Dilation Complete Time: 1410 Effacement (%): 100 Cervical Position: Middle Station: +2 Presentation: Vertex Exam by:: Herma CarsonLindsay Lima, rn  Will have her labor down for a couple of hours then start to push

## 2013-11-27 NOTE — Plan of Care (Signed)
Problem: Consults Goal: Postpartum Patient Education (See Patient Education module for education specifics.)  Outcome: Progressing  Problem: Phase II Progression Outcomes Goal: Tolerating diet Outcome: Completed/Met Date Met:  11/27/13

## 2013-11-27 NOTE — Anesthesia Preprocedure Evaluation (Signed)
Anesthesia Evaluation  Patient identified by MRN, date of birth, ID band Patient awake    Reviewed: Allergy & Precautions, H&P , Patient's Chart, lab work & pertinent test results  Airway Mallampati: III  TM Distance: >3 FB Neck ROM: Full    Dental no notable dental hx. (+) Teeth Intact   Pulmonary neg pulmonary ROS,  breath sounds clear to auscultation  Pulmonary exam normal       Cardiovascular hypertension, negative cardio ROS  Rhythm:Regular Rate:Normal     Neuro/Psych negative neurological ROS  negative psych ROS   GI/Hepatic Neg liver ROS, GERD-  ,  Endo/Other  diabetes, Well Controlled, GestationalMorbid obesity  Renal/GU negative Renal ROS  negative genitourinary   Musculoskeletal negative musculoskeletal ROS (+)   Abdominal (+) + obese,   Peds  Hematology   Anesthesia Other Findings   Reproductive/Obstetrics (+) Pregnancy                             Anesthesia Physical Anesthesia Plan  ASA: III  Anesthesia Plan: Epidural   Post-op Pain Management:    Induction:   Airway Management Planned: Natural Airway  Additional Equipment:   Intra-op Plan:   Post-operative Plan:   Informed Consent: I have reviewed the patients History and Physical, chart, labs and discussed the procedure including the risks, benefits and alternatives for the proposed anesthesia with the patient or authorized representative who has indicated his/her understanding and acceptance.   Dental advisory given  Plan Discussed with: Anesthesiologist  Anesthesia Plan Comments:         Anesthesia Quick Evaluation

## 2013-11-27 NOTE — Progress Notes (Signed)
Sheryl OaksHanna Pitts is a 18 y.o. G1P0 at 6641w3d by ultrasound admitted for active labor  Subjective: Comfortable with epidural .   Objective: BP 124/61 mmHg  Pulse 68  Temp(Src) 98.9 F (37.2 C) (Oral)  Resp 16  Ht 5\' 5"  (1.651 m)  Wt 257 lb (116.574 kg)  BMI 42.77 kg/m2  SpO2 98%  LMP 02/17/2013 I/O last 3 completed shifts: In: -  Out: 375 [Emesis/NG output:375]    FHT:  FHR: 130 bpm, variability: moderate,  accelerations:  Present,  decelerations:  Absent UC:   irregular, every 3 minutes SVE:   Dilation: 5 Effacement (%): 90 Station: -2 Exam by:: Herma CarsonLindsay Lima, rn  Labs: Lab Results  Component Value Date   WBC 16.9* 11/27/2013   HGB 13.9 11/27/2013   HCT 40.8 11/27/2013   MCV 89.7 11/27/2013   PLT 186 11/27/2013    Assessment / Plan: Augmentation of labor, progressing well  Labor: Progressing well despite Pitocin not being started when ordered Preeclampsia:  labs stable Fetal Wellbeing:  Category II Pain Control:  Epidural I/D:  n/a Anticipated MOD:  NSVD   Will start Pitocin as previously ordered  [facility] Recovery CenterWILLIAMS,Kuper Rennels 11/27/2013, 9:31 AM

## 2013-11-28 ENCOUNTER — Encounter (HOSPITAL_COMMUNITY): Payer: Self-pay | Admitting: *Deleted

## 2013-11-28 NOTE — Progress Notes (Signed)
UR chart review completed.  

## 2013-11-28 NOTE — Progress Notes (Signed)
Post Partum Day 1 Subjective:  Sheryl Pitts is a 18 y.o. G1P1001 3116w3d s/p NSVD.  No acute events overnight.  Pt denies problems with ambulating, voiding or po intake.  She denies nausea or vomiting.  Pain is moderately controlled.  She has had flatus. She has not had bowel movement.  Lochia Moderate.  Plan for birth control is nexplanon.  Method of Feeding: Breast  Objective: Blood pressure 109/50, pulse 66, temperature 98.3 F (36.8 C), temperature source Oral, resp. rate 20, height 5\' 5"  (1.651 m), weight 116.574 kg (257 lb), last menstrual period 02/17/2013, SpO2 98 %, unknown if currently breastfeeding.  Physical Exam:  General: alert, cooperative and no distress Lochia:normal flow Chest: CTAB Heart: RRR no m/r/g Abdomen: +BS, soft, nontender,  Uterine Fundus: firm, below umbilicus DVT Evaluation: No evidence of DVT seen on physical exam. Extremities: no edema   Recent Labs  11/27/13 0345  HGB 13.9  HCT 40.8    Assessment/Plan:  ASSESSMENT: Sheryl OaksHanna Schorr is a 18 y.o. G1P1001 3016w3d s/p NSVD  Plan for discharge tomorrow   LOS: 1 day   Jacquiline Doearker, Caleb 11/28/2013, 7:35 AM

## 2013-11-28 NOTE — Lactation Note (Signed)
This note was copied from the chart of Sheryl Rubye OaksHanna Leggette. Lactation Consultation Note New mom having difficulty w/keeping baby latched. Mom very tired and sleepy. Baby hungry. Mom has semi flat nipples, rolls well in finger tips. Very compressible nipples. Large areola, small nipples. Breast slightly lumpy, especially around nipple area. I think it may be ducts, when massaged get good flow transitioning colostrum. Mom states that she has been leaking since early pregnancy. Gave hand pump to assist in everting nipples more prior to latching, and instructed to wear shells w/bra when gets up this morning. Mom has tubular breast w/a slight gag between breast, denies PCOS dx. Mom encouraged to feed baby 8-12 times/24 hours and with feeding cues. Mom encouraged to waken baby for feeds.  Educated about newborn behavior. Encouraged to call for assistance if needed and to verify proper latch. Mom encouraged to do skin-to-skin. Referred to Baby and Me Book in Breastfeeding section Pg. 22-23 for position options and Proper latch demonstration.Encouraged comfort during BF so colostrum flows better and mom will enjoy the feeding longer. Taking deep breaths and breast massage during BF. WH/LC brochure given w/resources, support groups and LC services.  Patient Name: Sheryl Rubye OaksHanna Wingard [id number]'s Date: 11/28/2013 Reason for consult: Initial assessment   Maternal Data Has patient been taught Hand Expression?: Yes  Feeding Feeding Type: Breast Milk Length of feed: 5 min  LATCH Score/Interventions Latch: Repeated attempts needed to sustain latch, nipple held in mouth throughout feeding, stimulation needed to elicit sucking reflex. Intervention(s): Adjust position;Assist with latch;Breast massage;Breast compression  Audible Swallowing: A few with stimulation Intervention(s): Skin to skin;Hand expression Intervention(s): Skin to skin;Hand expression;Alternate breast massage  Type of Nipple: Flat (semi  flat) Intervention(s): Shells;Hand pump  Comfort (Breast/Nipple): Soft / non-tender     Hold (Positioning): Assistance needed to correctly position infant at breast and maintain latch. Intervention(s): Breastfeeding basics reviewed;Support Pillows;Position options;Skin to skin  LATCH Score: 6  Lactation Tools Discussed/Used Tools: Shells;Pump Shell Type: Inverted Breast pump type: Manual Pump Review: Milk Storage;Setup, frequency, and cleaning Initiated by:: Peri JeffersonL. Damacio Weisgerber RN Date initiated:: 11/28/13   Consult Status Date: 11/28/13 Follow-up type: In-patient    Sheryl Pitts, Sheryl Pitts 11/28/2013, 2:32 AM

## 2013-11-28 NOTE — Plan of Care (Signed)
Problem: Phase I Progression Outcomes Goal: Other Phase I Outcomes/Goals Outcome: Completed/Met Date Met:  11/28/13  Problem: Phase II Progression Outcomes Goal: Incision intact & without signs/symptoms of infection Outcome: Not Applicable Date Met:  89/79/15 Goal: Other Phase II Outcomes/Goals Outcome: Completed/Met Date Met:  11/28/13

## 2013-11-28 NOTE — Plan of Care (Signed)
Problem: Phase I Progression Outcomes Goal: Pain controlled with appropriate interventions Outcome: Completed/Met Date Met:  11/28/13 Goal: Voiding adequately Outcome: Completed/Met Date Met:  11/28/13 Goal: VS, stable, temp < 100.4 degrees F Outcome: Completed/Met Date Met:  11/28/13  Problem: Phase II Progression Outcomes Goal: Pain controlled on oral analgesia Outcome: Completed/Met Date Met:  11/28/13 Goal: Progress activity as tolerated unless otherwise ordered Outcome: Completed/Met Date Met:  11/28/13 Goal: Afebrile, VS remain stable Outcome: Completed/Met Date Met:  11/28/13  Problem: Discharge Progression Outcomes Goal: Activity appropriate for discharge plan Outcome: Completed/Met Date Met:  11/28/13 Goal: Tolerating diet Outcome: Completed/Met Date Met:  11/28/13 Goal: Pain controlled with appropriate interventions Outcome: Completed/Met Date Met:  11/28/13

## 2013-11-28 NOTE — Lactation Note (Signed)
This note was copied from the chart of Sheryl Pitts [place]. Lactation Consultation Note  Patient Name: Sheryl Pitts Quant UJWJX'BToday's Date: 11/28/2013 Reason for consult: Follow-up assessment Baby 22 hours of life. Patient's MBU RN Harriett SineNancy discussed with LC that mom is having difficulty nursing baby. Mom's breast are difficult to compress, and baby not able to sustain a latch. Mom is able to hand express colostrum. Fitted mom with a #20 NS. Baby latched deeply, suckling rhythmically with lips flanged and intermittent swallows noted. LC assess first 20 minutes of BF and showed mom colostrum in NS. Discussed with mom the need to keep attempting to latch baby directly to breast as NS is considered a temporary device to assist with BF. [facility], aware of OP/BFSG, community resources and [facility]. Discussed with mom the need to have baby's weight followed frequently while using NS. Enc mom to offer lots of STS, and to nurse with cues. Discussed assessment and interventions with patient's MBU RN Harriett SineNancy. Enc mom to call out for assistance with nursing as needed.   Maternal Data    Feeding  Feeding Type: Breast Fed Length of feed:  (LC assessed first 20 minutes of BF.)  LATCH Score/Interventions Latch: Grasps breast easily, tongue down, lips flanged, rhythmical sucking. Intervention(s): Skin to skin;Waking techniques;Teach feeding cues Intervention(s): Adjust position;Assist with latch  Audible Swallowing: Spontaneous and intermittent Intervention(s): Skin to skin  Type of Nipple: Flat Intervention(s): Reverse pressure  Comfort (Breast/Nipple): Soft / non-tender     Hold (Positioning): No assistance needed to correctly position infant at breast. Intervention(s): Breastfeeding basics reviewed;Support Pillows;Position options  LATCH Score: 9  Lactation Tools Discussed/Used Nipple shield size: 20   Consult Status Consult Status: Follow-up Date: 11/29/13 Follow-up type:  In-patient    [name], [name] 11/28/2013, 3:54 PM

## 2013-11-28 NOTE — Anesthesia Postprocedure Evaluation (Signed)
Anesthesia Post Note  Patient: Sheryl Pitts  Procedure(s) Performed: * No procedures listed *  Anesthesia type: Epidural  Patient location: Mother/Baby  Post pain: Pain level controlled  Post assessment: Post-op Vital signs reviewed  Last Vitals:  Filed Vitals:   11/28/13 0230  BP: 109/50  Pulse: 66  Temp: 36.8 C  Resp: 20    Post vital signs: Reviewed  Level of consciousness:alert  Complications: No apparent anesthesia complications

## 2013-11-29 ENCOUNTER — Other Ambulatory Visit: Payer: Medicaid Other

## 2013-11-29 MED ORDER — IBUPROFEN 600 MG PO TABS: 600.0000 mg | ORAL_TABLET | Freq: Four times a day (QID) | ORAL | Status: DC

## 2013-11-29 NOTE — Discharge Instructions (Signed)

## 2013-11-29 NOTE — Progress Notes (Signed)
Nipples sore, comfort gels given.

## 2013-11-29 NOTE — Lactation Note (Signed)
This note was copied from the chart of Girl Rubye OaksHanna Cahoon. Lactation Consultation Note  Patient Name: Girl Rubye OaksHanna Tang WUJWJ'XToday's Date: 11/29/2013 Reason for consult: Follow-up assessment;Difficult latch  Mom finger fed using curved tip syringe over night.  Offered assistance with latching baby to breast, and Mom agreeable. Positioned baby in football hold, and tried to help baby latched to breast without nipple shield.  Baby latched but quickly came off fussy.  Initiated use of 20 mm nipple shield, with instructions on use and care.  Instructed Mom to use manual breast expression prior, colostrum easily expressed.  Baby latched after 2 attempts and numerous, regular sucking and swallowing noted.  Basic breast feeding education reviewed with Mom.  Family member purchased a Medela Pump in [employer], and reviewed care of pump parts and use.  Instructed Mom to pump both breasts after every other feeding when baby latching using the nipple shield.  If Mom is unable to latch baby, and Mom uses formula+/EBM to supplement (has volume parameters), Mom knows to pump both breasts 15-20 mins.  Reviewed breast milk storage guidelines.  OP lactation appointment recommended, and appt made for 12/03/11 @ 2:30pm.  Mom denies any questions at present.  Engorgement prevention and treatment reviewed.  To call prn.  Consult Status Consult Status: Follow-up Date: 12/02/13 Follow-up type: Out-patient    Judee ClaraSmith, Esterlene Atiyeh E 11/29/2013, 11:35 AM

## 2013-11-29 NOTE — Discharge Summary (Signed)
Obstetric Discharge Summary Reason for Admission: rupture of membranes Prenatal Procedures: ultrasound Intrapartum Procedures: spontaneous vaginal delivery Postpartum Procedures: none Complications-Operative and Postpartum: none   Hospital course:  Admitted with SROM/early labor.  Was augmented, and went on to deliver vaginally without difficulty.  No postpartum problems.     HEMOGLOBIN  Date Value Ref Range Status  11/27/2013 13.9 12.0 - 15.0 g/dL Final   HCT  Date Value Ref Range Status  11/27/2013 40.8 36.0 - 46.0 % Final    Physical Exam:  General: alert, cooperative and no distress Lochia: appropriate Uterine Fundus: firm Incision: n/a DVT Evaluation: No evidence of DVT seen on physical exam.  Discharge Diagnoses: Term Pregnancy-delivered  Discharge Information: Date: 11/29/2013 Activity: pelvic rest Diet: routine Medications: Ibuprofen Condition: stable Instructions: refer to practice specific booklet Discharge to: home   Newborn Data: Live born female  Birth Weight: 7 lb 8.8 oz (3425 g) APGAR: 9, 9  Home with mother.  CRESENZO-DISHMAN,Xitlaly Ault 11/29/2013, 7:33 AM

## 2013-12-01 ENCOUNTER — Inpatient Hospital Stay (HOSPITAL_COMMUNITY): Admission: RE | Admit: 2013-12-01 | Payer: Medicaid Other | Source: Ambulatory Visit

## 2013-12-02 ENCOUNTER — Ambulatory Visit (HOSPITAL_COMMUNITY)
Admit: 2013-12-02 | Discharge: 2013-12-02 | Disposition: A | Discharge status: Home / Self Care | Payer: Medicaid Other | Attending: Obstetrics and Gynecology | Admitting: Obstetrics and Gynecology

## 2013-12-02 NOTE — Lactation Note (Signed)
Lactation Consult Pt was 30 minutes late for appointment.  Mom has been pumping and bottling [employer] and may continue to do this.  Baby's Weight today is 7+8 oz.  Parents will call if they decide to put her back to the breast.  Lactation Consultant:  Soyla DryerJoseph, Yee Gangi  ________________________________________________________________________    ________________________________________________________________________

## 2013-12-27 ENCOUNTER — Ambulatory Visit (INDEPENDENT_AMBULATORY_CARE_PROVIDER_SITE_OTHER): Payer: Medicaid Other | Admitting: Family Medicine

## 2013-12-27 ENCOUNTER — Encounter: Payer: Self-pay | Admitting: Family Medicine

## 2013-12-27 VITALS — BP 125/68 | HR 61 | Temp 98.0°F | Ht 66.0 in | Wt 241.0 lb

## 2013-12-27 DIAGNOSIS — Z30018 Encounter for initial prescription of other contraceptives: Secondary | ICD-10-CM

## 2013-12-27 LAB — POCT PREGNANCY, URINE: PREG TEST UR: NEGATIVE

## 2013-12-27 MED ORDER — ETONOGESTREL 68 MG ~~LOC~~ IMPL
68.0000 mg | DRUG_IMPLANT | Freq: Once | SUBCUTANEOUS | Status: AC
  Administered 2013-12-27: 68 mg via SUBCUTANEOUS

## 2013-12-27 NOTE — Progress Notes (Signed)
  Subjective:     Sheryl Pitts is a 18 y.o. female who presents for a postpartum visit. She is 4 weeks postpartum following a spontaneous vaginal delivery. I have fully reviewed the prenatal and intrapartum course. The delivery was at 40 gestational weeks. Outcome: spontaneous vaginal delivery. Anesthesia: epidural. Postpartum course has been unremarkable. Baby's course has been normal. Baby is feeding by bottle - Similac Advance. Bleeding staining only. Bowel function is normal. Bladder function is normal. Patient is not sexually active. Contraception method is none. Postpartum depression screening: negative.  The following portions of the patient's history were reviewed and updated as appropriate: allergies, current medications, past family history, past medical history, past social history, past surgical history and problem list.  Review of Systems A comprehensive review of systems was negative.   Objective:    BP 125/68 mmHg  Pulse 61  Temp(Src) 98 F (36.7 C)  Ht 5\' 6"  (1.676 m)  Wt 241 lb (109.317 kg)  BMI 38.92 kg/m2  Breastfeeding? No  General:  alert, cooperative and appears stated age   Vulva:  normal  Vagina: normal vagina  Cervix:  multiparous appearance and no cervical motion tenderness  Corpus: normal size, contour, position, consistency, mobility, non-tender and uterus is remains mildly enlarged  Adnexa:  normal adnexa        Assessment:     Normal postpartum exam. Pap smear done at today's visit.   Plan:    1. Contraception: Nexplanon 2. Pap not due for 2-3 years 3. Follow up in: several months or as needed.

## 2013-12-27 NOTE — Patient Instructions (Signed)
Etonogestrel implant What is this medicine? ETONOGESTREL (et oh noe JES trel) is a contraceptive (birth control) device. It is used to prevent pregnancy. It can be used for up to 3 years. This medicine may be used for other purposes; ask your health care provider or pharmacist if you have questions. COMMON BRAND NAME(S): Implanon, Nexplanon What should I tell my health care provider before I take this medicine? They need to know if you have any of these conditions: -abnormal vaginal bleeding -blood vessel disease or blood clots -cancer of the breast, cervix, or liver -depression -diabetes -gallbladder disease -headaches -heart disease or recent heart attack -high blood pressure -high cholesterol -kidney disease -liver disease -renal disease -seizures -tobacco smoker -an unusual or allergic reaction to etonogestrel, other hormones, anesthetics or antiseptics, medicines, foods, dyes, or preservatives -pregnant or trying to get pregnant -breast-feeding How should I use this medicine? This device is inserted just under the skin on the inner side of your upper arm by a health care professional. Talk to your pediatrician regarding the use of this medicine in children. Special care may be needed. Overdosage: If you think you've taken too much of this medicine contact a poison control center or emergency room at once. Overdosage: If you think you have taken too much of this medicine contact a poison control center or emergency room at once. NOTE: This medicine is only for you. Do not share this medicine with others. What if I miss a dose? This does not apply. What may interact with this medicine? Do not take this medicine with any of the following medications: -amprenavir -bosentan -fosamprenavir This medicine may also interact with the following medications: -barbiturate medicines for inducing sleep or treating seizures -certain medicines for fungal infections like ketoconazole and  itraconazole -griseofulvin -medicines to treat seizures like carbamazepine, felbamate, oxcarbazepine, phenytoin, topiramate -modafinil -phenylbutazone -rifampin -some medicines to treat HIV infection like atazanavir, indinavir, lopinavir, nelfinavir, tipranavir, ritonavir -St. John's wort This list may not describe all possible interactions. Give your health care provider a list of all the medicines, herbs, non-prescription drugs, or dietary supplements you use. Also tell them if you smoke, drink alcohol, or use illegal drugs. Some items may interact with your medicine. What should I watch for while using this medicine? This product does not protect you against HIV infection (AIDS) or other sexually transmitted diseases. You should be able to feel the implant by pressing your fingertips over the skin where it was inserted. Tell your doctor if you cannot feel the implant. What side effects may I notice from receiving this medicine? Side effects that you should report to your doctor or health care professional as soon as possible: -allergic reactions like skin rash, itching or hives, swelling of the face, lips, or tongue -breast lumps -changes in vision -confusion, trouble speaking or understanding -dark urine -depressed mood -general ill feeling or flu-like symptoms -light-colored stools -loss of appetite, nausea -right upper belly pain -severe headaches -severe pain, swelling, or tenderness in the abdomen -shortness of breath, chest pain, swelling in a leg -signs of pregnancy -sudden numbness or weakness of the face, arm or leg -trouble walking, dizziness, loss of balance or coordination -unusual vaginal bleeding, discharge -unusually weak or tired -yellowing of the eyes or skin Side effects that usually do not require medical attention (Report these to your doctor or health care professional if they continue or are bothersome.): -acne -breast pain -changes in  weight -cough -fever or chills -headache -irregular menstrual bleeding -itching, burning, and   vaginal discharge -pain or difficulty passing urine -sore throat This list may not describe all possible side effects. Call your doctor for medical advice about side effects. You may report side effects to FDA at 1-800-FDA-1088. Where should I keep my medicine? This drug is given in a hospital or clinic and will not be stored at home. NOTE: This sheet is a summary. It may not cover all possible information. If you have questions about this medicine, talk to your doctor, pharmacist, or health care provider.  2015, Elsevier/Gold Standard. (2011-07-11 15:37:45)  

## 2014-01-03 ENCOUNTER — Encounter: Payer: Self-pay | Admitting: *Deleted

## 2014-09-26 ENCOUNTER — Encounter (HOSPITAL_COMMUNITY): Payer: Self-pay

## 2014-09-26 ENCOUNTER — Emergency Department (HOSPITAL_COMMUNITY)
Admission: EM | Admit: 2014-09-26 | Discharge: 2014-09-26 | Disposition: A | Discharge status: Home / Self Care | Payer: Medicaid Other | Attending: Emergency Medicine | Admitting: Emergency Medicine

## 2014-09-26 DIAGNOSIS — M545 Low back pain, unspecified: Secondary | ICD-10-CM

## 2014-09-26 DIAGNOSIS — M549 Dorsalgia, unspecified: Secondary | ICD-10-CM | POA: Diagnosis present

## 2014-09-26 MED ORDER — NAPROXEN 500 MG PO TABS: 500.0000 mg | ORAL_TABLET | Freq: Two times a day (BID) | ORAL | Status: DC

## 2014-09-26 MED ORDER — METHOCARBAMOL 500 MG PO TABS: 500.0000 mg | ORAL_TABLET | Freq: Two times a day (BID) | ORAL | Status: DC

## 2014-09-26 NOTE — ED Notes (Signed)
Pt states started having back pain when she was pregnant.  Pain has increased over time. Child is 41 months old.  Denies urinary symptoms.  Worse with position changes.

## 2014-09-26 NOTE — ED Provider Notes (Signed)
CSN: [id number]     Arrival date & time 09/26/14  0848 History   First MD Initiated Contact with Patient 09/26/14 [phone]     Chief Complaint  Patient presents with  . Back Pain     (Consider location/radiation/quality/duration/timing/severity/associated sxs/prior Treatment) HPI Comments: Patient presents today with lower back pain.  Pain has been present intermittently for the past year.  Pain located across her lower back and is worse with movement.  Denies radiation of pain.  She has a 80 month old son and lifts her child a lot and the car seat.  She describes the pain as occasional "spasms".  She has taken Ibuprofen for pain with mild relief.  She denies bowel/bladder incontinence, fever, chills, numbness, tingling, abdominal pain, vaginal bleeding, or urinary symptoms.  Denies history of IVDU or Cancer.  Patient is a 19 y.o. female presenting with back pain. The history is provided by the patient.  Back Pain   Past Medical History  Diagnosis Date  . Medical history non-contributory    Past Surgical History  Procedure Laterality Date  . Wisdom tooth extraction     Family History  Problem Relation Age of Onset  . Hypertension Father   . Diabetes Neg Hx    Social History  Substance Use Topics  . Smoking status: Never Smoker   . Smokeless tobacco: Never Used  . Alcohol Use: No   OB History    Gravida Para Term Preterm AB TAB SAB Ectopic Multiple Living   0 1     Review of Systems  Musculoskeletal: Positive for back pain.  All other systems reviewed and are negative.     Allergies  Review of patient's allergies indicates no known allergies.  Home Medications   Prior to Admission medications   Medication Sig Start Date End Date Taking? Authorizing Provider  methocarbamol (ROBAXIN) 500 MG tablet Take 1 tablet (500 mg total) by mouth 2 (two) times daily. 09/26/14   Chiamaka Latka, PA-C  naproxen (NAPROSYN) 500 MG tablet Take 1 tablet (500 mg total) by mouth 2  (two) times daily. 09/26/14   Santiago Glad, PA-C  Prenatal Vit-Fe Fumarate-FA (PRENATAL VITAMINS) 28-0.8 MG TABS Take 1 tablet by mouth.    Historical Provider, MD   BP 133/89 mmHg  Pulse 63  Temp(Src) 98.2 F (36.8 C) (Oral)  Resp 15  SpO2 96% Physical Exam  Constitutional: She appears well-developed and well-nourished.  HENT:  Head: Normocephalic and atraumatic.  Mouth/Throat: Oropharynx is clear and moist.  Neck: Normal range of motion. Neck supple.  Cardiovascular: Normal rate, regular rhythm and normal heart sounds.   Pulmonary/Chest: Effort normal and breath sounds normal.  Abdominal: Soft. Bowel sounds are normal. She exhibits no distension and no mass. There is no tenderness. There is no rebound and no guarding.  Musculoskeletal: Normal range of motion.  Implanon of the LUE  Neurological: She is alert. She has normal strength. No sensory deficit. Gait normal.  Reflex Scores:      Patellar reflexes are 2+ on the right side and 2+ on the left side. Distal sensation of both feet intact Muscle strength 5/5 of LE bilaterally  Skin: Skin is warm and dry.  Psychiatric: She has a normal mood and affect.  Nursing note and vitals reviewed.   ED Course  Procedures (including critical care time) Labs Review Labs Reviewed - No data to display  Imaging Review No results found. I have personally reviewed and evaluated  these images and lab results as part of my medical decision-making.   EKG Interpretation None      MDM   Final diagnoses:  None   Patient with back pain.  No neurological deficits and normal neuro exam.  Patient can walk but states is painful.  No loss of bowel or bladder control.  No concern for cauda equina.  No fever, night sweats, weight loss, h/o cancer, IVDU.  Patient stable for discharge.  Return precautions given.      Santiago Glad, PA-C 09/26/14 1191  Glynn Octave, MD 09/26/14 [phone]

## 2014-11-27 IMAGING — US US OB TRANSVAGINAL
1 series · 14 of 28 positions shown · non-contrast
Comparison: None.

CLINICAL DATA: Abdominal pain and early pregnancy with estimated
gestational age of 10 weeks and 5 days by last menstrual period.

EXAM:
OBSTETRIC <14 WK US AND TRANSVAGINAL OB US
TECHNIQUE: Both transabdominal and transvaginal ultrasound examinations were
performed for complete evaluation of the gestation as well as the
maternal uterus, adnexal regions, and pelvic cul-de-sac.
Transvaginal technique was performed to assess early pregnancy.

[Series 1: us ob transvaginal · 0.25mm/px · 14 of 36 slices shown]
[im 2/36]
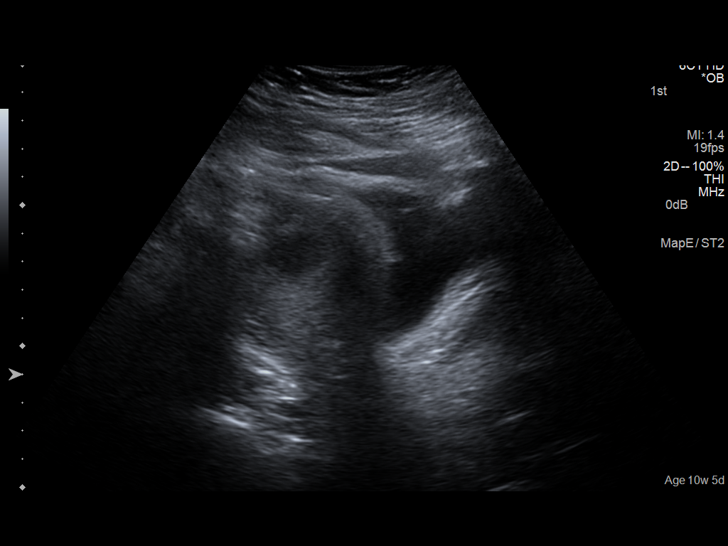
[im 4/36]
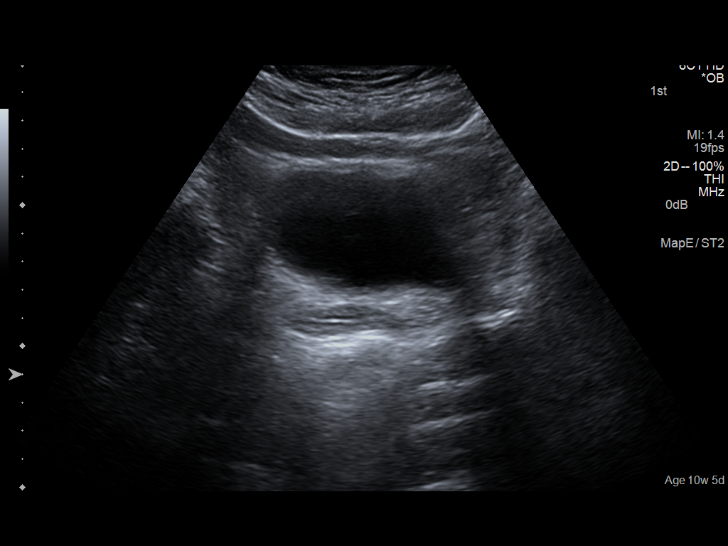
[im 7/36]
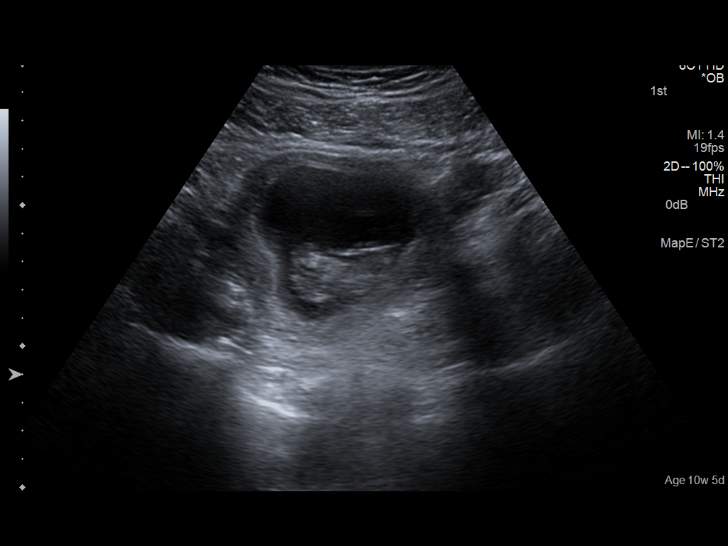
[im 10/36]
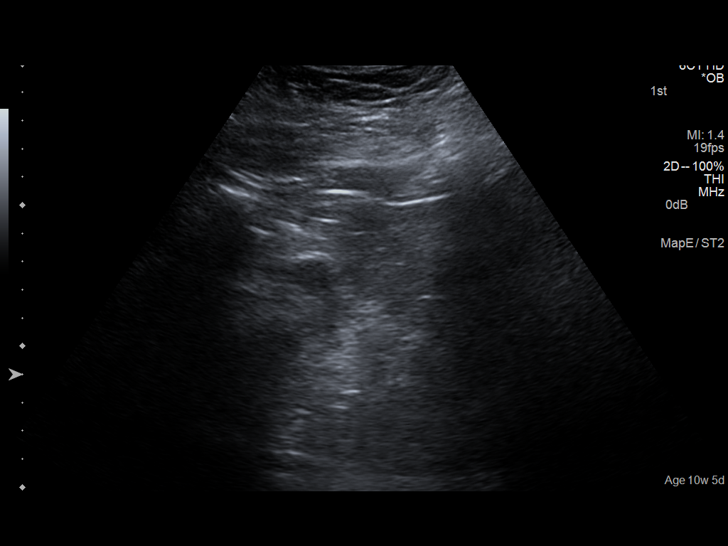
[im 12/36]
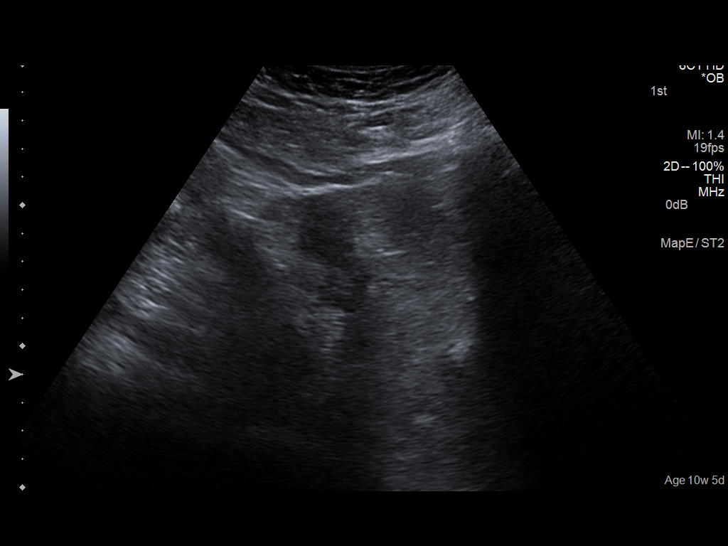
[im 15/36]
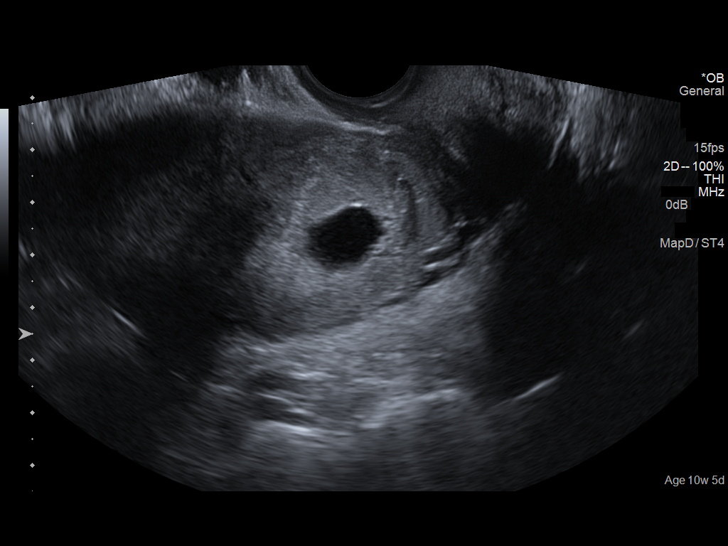
[im 17/36]
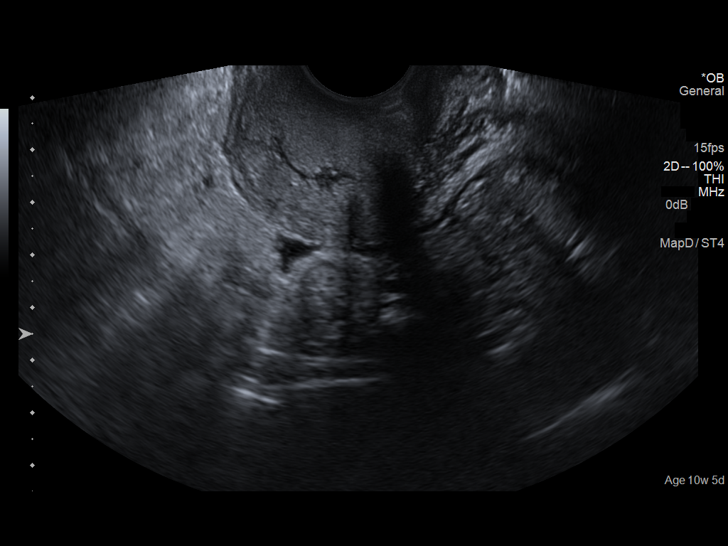
[im 20/36]
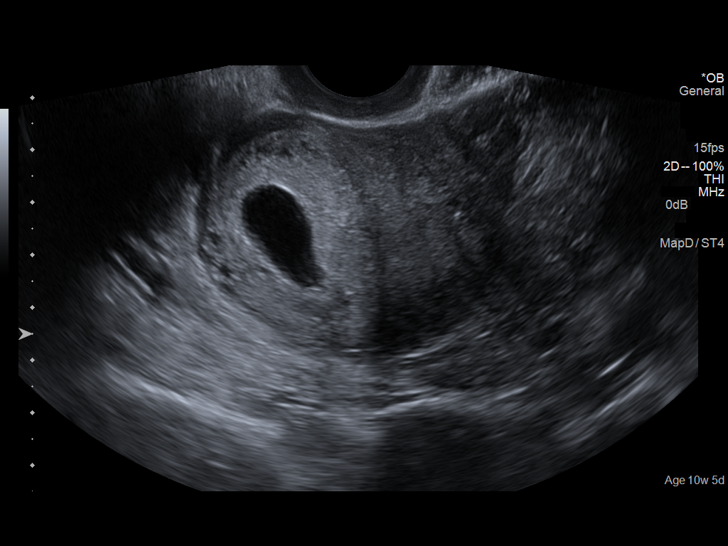
[im 23/36]
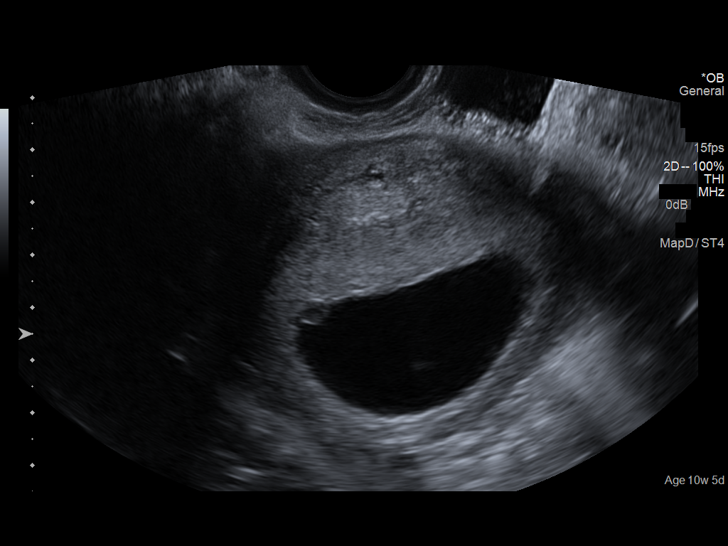
[im 25/36]
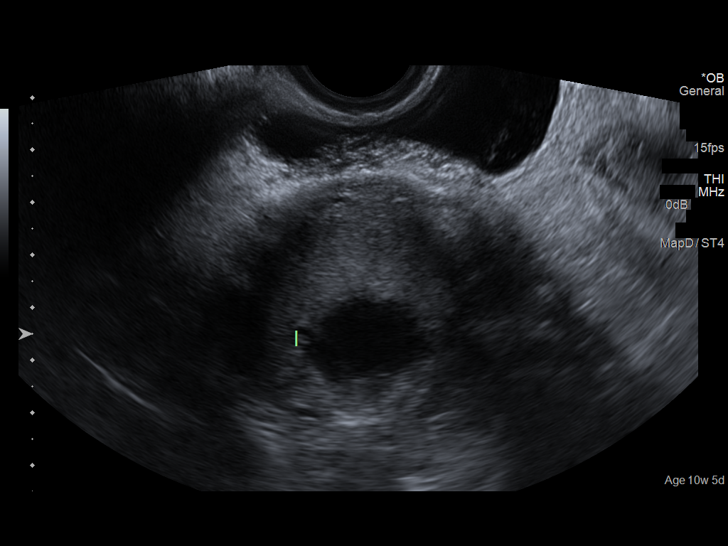
[im 28/36]
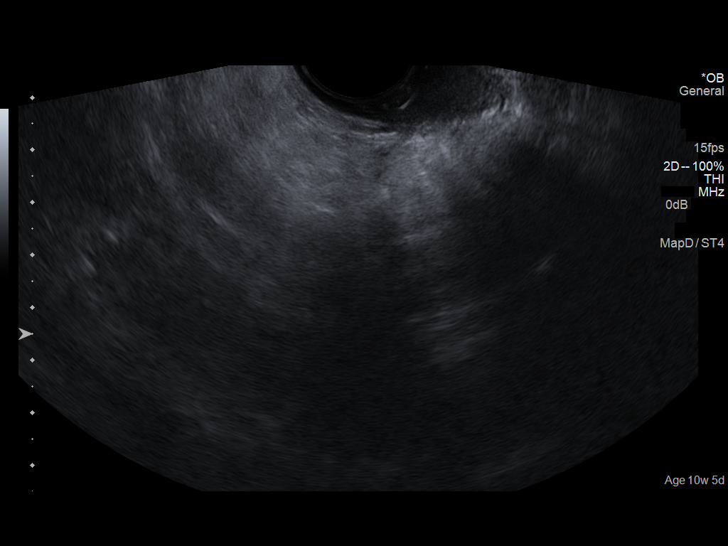
[im 30/36]
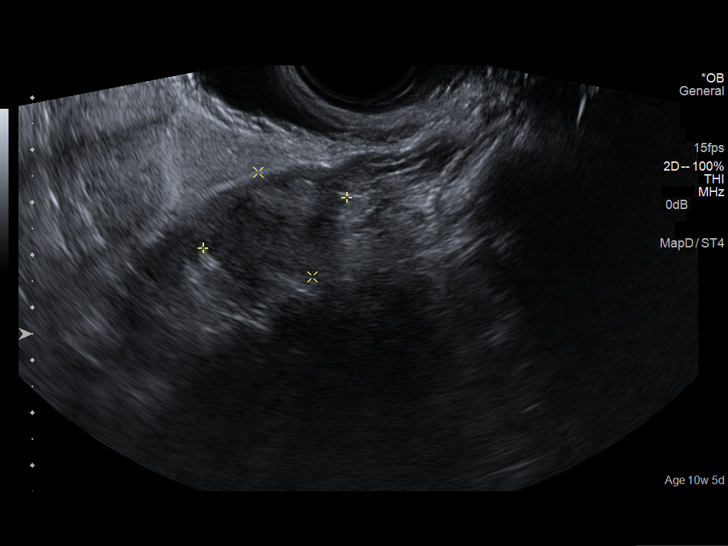
[im 33/36]
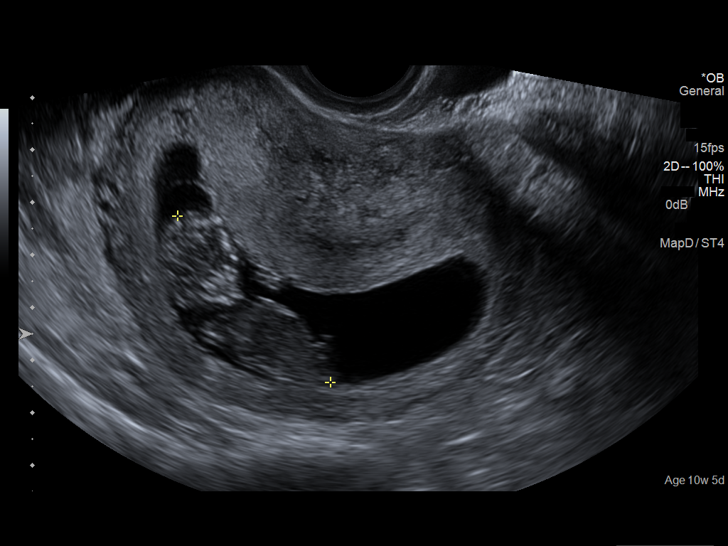
[im 36/36]
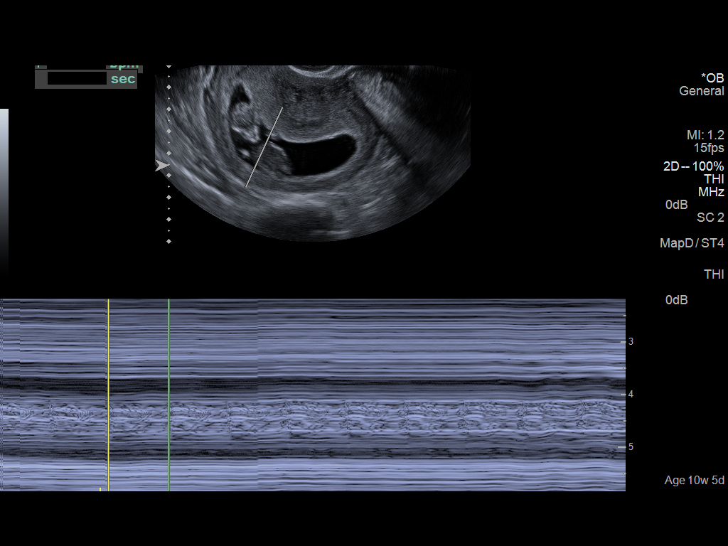

[14 of 28 positions shown; findings below may reference images not displayed]

FINDINGS: Intrauterine gestational sac: Visualized/normal in shape.

Yolk sac:  Visualized.

Embryo:  Single embryo visualized.

Cardiac Activity: Regular cardiac activity detected.

Heart Rate:  171 bpm

MSD:    mm    w     d

CRL:   42  mm   11 w 0 d                  US EDC: 11/22/2013

No subchorionic hemorrhage identified.

Maternal uterus/adnexae: The right ovary was not visualized. The
left ovary is normal in appearance.
IMPRESSION: Viable single intrauterine gestation with estimated gestational age
of 11 weeks and 0 days by ultrasound. Ultrasound EDC is 11/22/2013.
No abnormalities are identified.

## 2015-03-03 IMAGING — US US OB FOLLOW-UP
1 series · 12 of 28 positions shown · non-contrast
Comparison: none

[Series 1: us ob follow up · 12 of 74 slices shown]
[im 3/74]
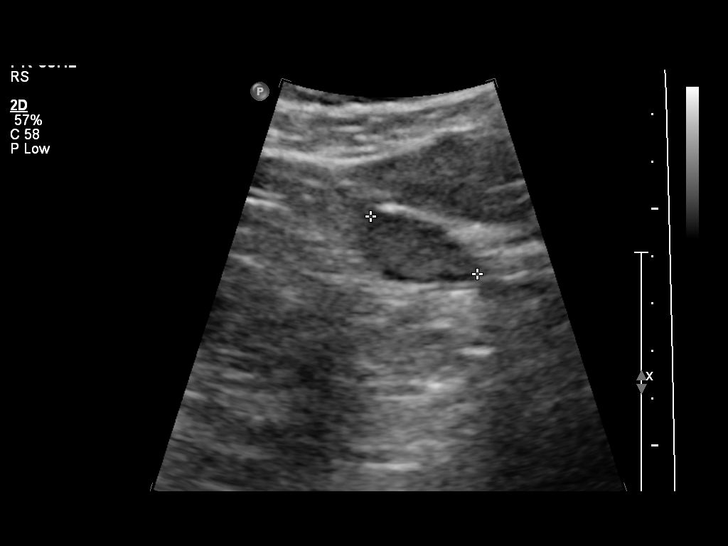
[im 9/74]
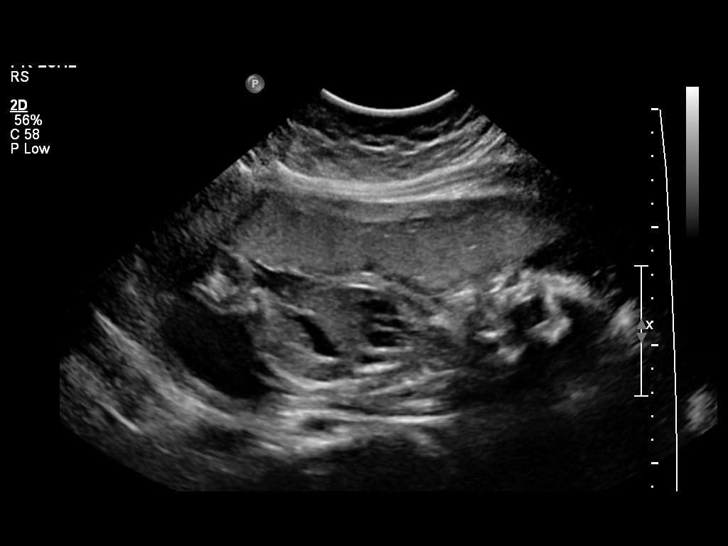
[im 14/74]
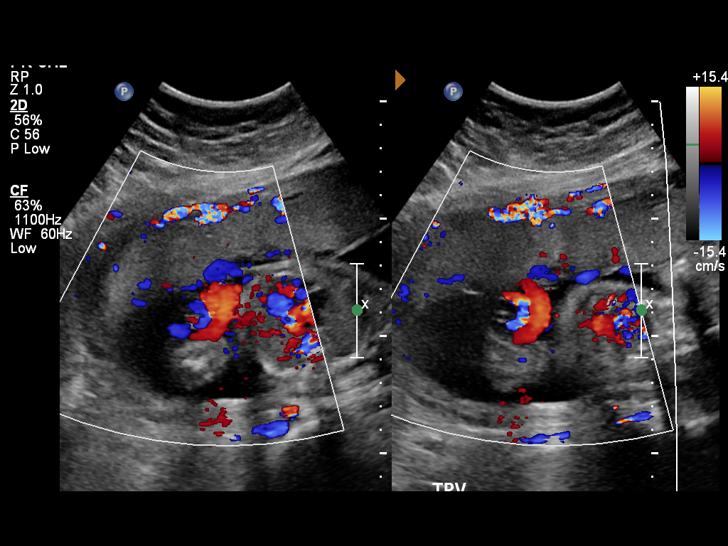
[im 22/74]
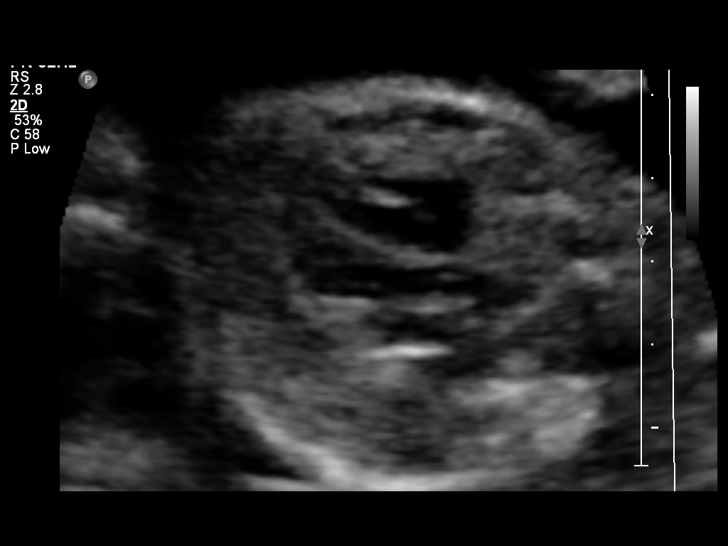
[im 28/74]
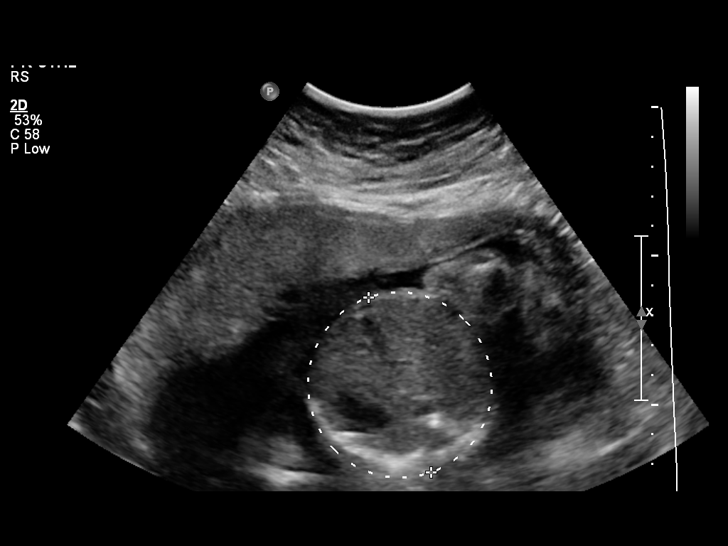
[im 33/74]
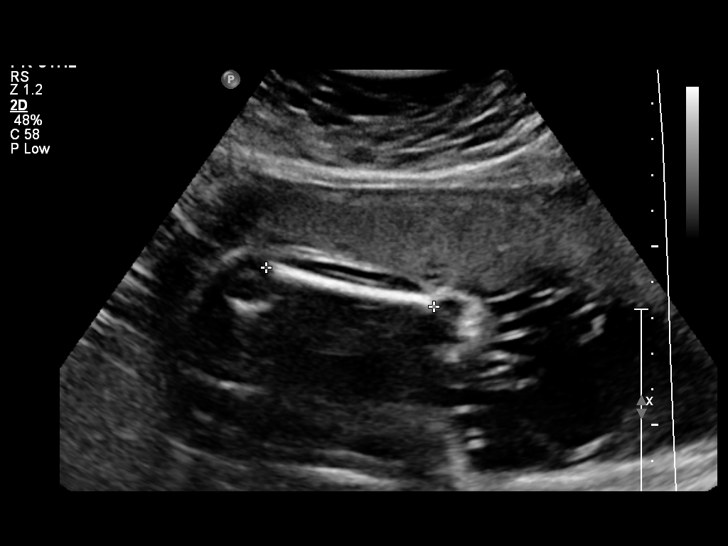
[im 41/74]
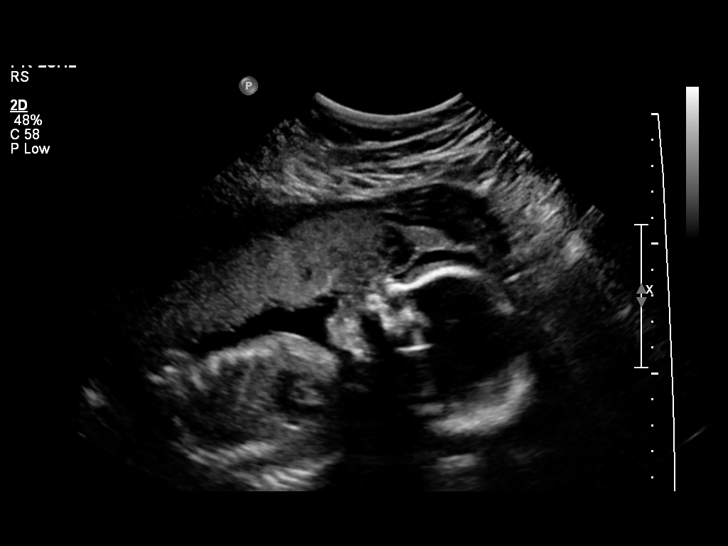
[im 46/74]
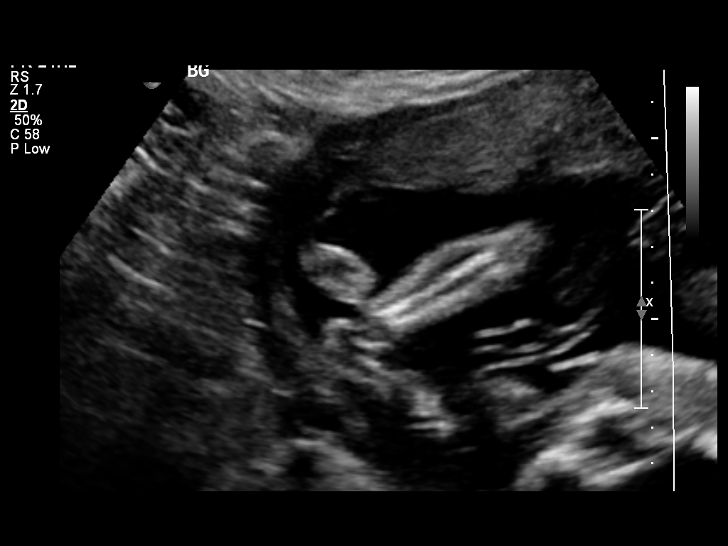
[im 52/74]
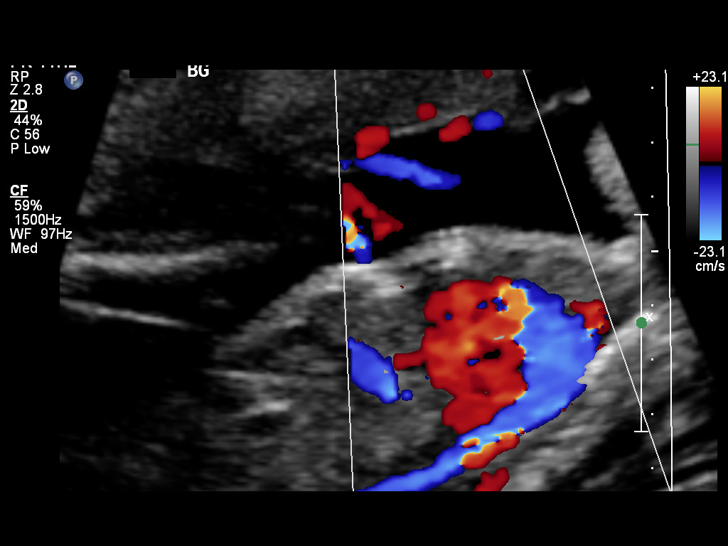
[im 60/74]
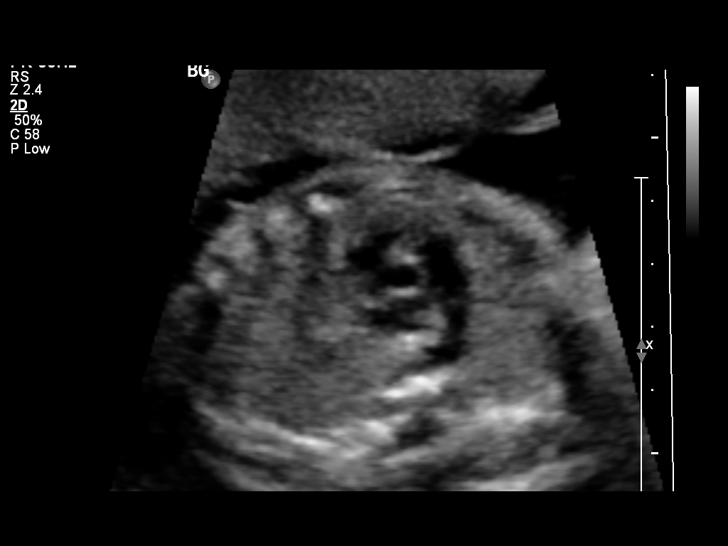
[im 65/74]
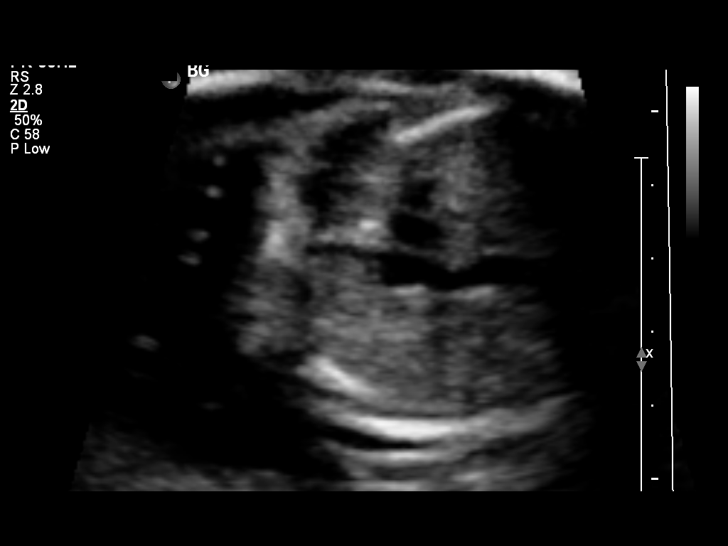
[im 71/74]
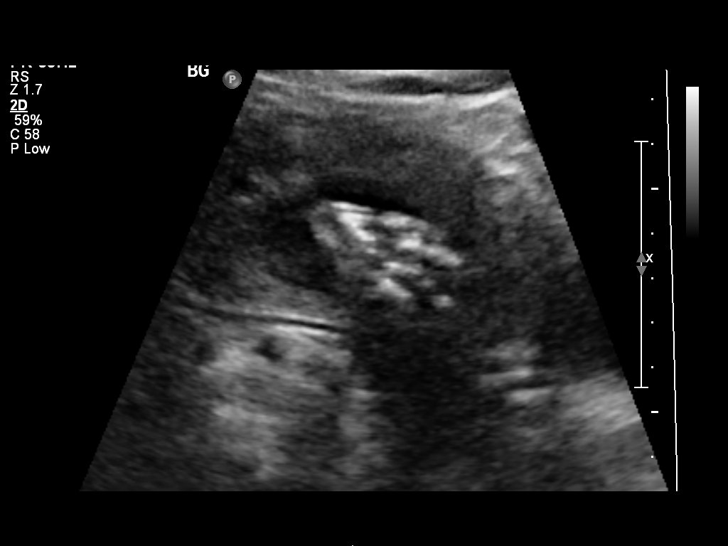

[12 of 28 positions shown; findings below may reference images not displayed]

OBSTETRICS REPORT
                      (Signed Final 08/07/2013 [DATE])

Service(s) Provided

 US OB FOLLOW UP                                       76816.1
Indications

 Follow-up incomplete fetal anatomic evaluation
Fetal Evaluation

 Num Of Fetuses:    1
 Fetal Heart Rate:  158                          bpm
 Cardiac Activity:  Observed
 Presentation:      Cephalic
 Placenta:          Anterior, above cervical os
 P. Cord            Previously Visualized
 Insertion:

 Amniotic Fluid
 AFI FV:      Subjectively within normal limits
 AFI Sum:     15.79   cm       57  %Tile     Larg Pckt:    4.31  cm
 RUQ:   4.31    cm   RLQ:    3.94   cm    LUQ:   3.46    cm   LLQ:    4.08   cm
Biometry

 BPD:     61.7  mm     G. Age:  25w 1d                CI:        73.76   70 - 86
                                                      FL/HC:      20.7   18.7 -

 HC:     228.2  mm     G. Age:  24w 6d       47  %    HC/AC:      1.11   1.05 -

 AC:     206.2  mm     G. Age:  25w 2d       65  %    FL/BPD:     76.7   71 - 87
 FL:      47.3  mm     G. Age:  25w 6d       78  %    FL/AC:      22.9   20 - 24
 HUM:     41.6  mm     G. Age:  25w 1d       58  %

 Est. FW:     802  gm    1 lb 12 oz      71  %
Gestational Age

 LMP:           24w 3d        Date:  02/17/13                 EDD:   11/24/13
 U/S Today:     25w 2d                                        EDD:   11/18/13
 Best:          24w 3d     Det. By:  LMP  (02/17/13)          EDD:   11/24/13
Anatomy

 Cranium:          Appears normal         Aortic Arch:      Appears normal
 Fetal Cavum:      Previously seen        Ductal Arch:      Appears normal
 Ventricles:       Appears normal         Diaphragm:        Appears normal
 Choroid Plexus:   Previously seen        Stomach:          Appears normal, left
                                                            sided
 Cerebellum:       Previously seen        Abdomen:          Appears normal
 Posterior Fossa:  Previously seen        Abdominal Wall:   Previously seen
 Nuchal Fold:      Not applicable (>20    Cord Vessels:     Previously seen
                   wks GA)
 Face:             Appears normal         Kidneys:          Appear normal
                   (orbits and profile)
 Lips:             Previously seen        Bladder:          Appears normal
 Heart:            Echogenic focus        Spine:            Previously seen
                   in RV
 RVOT:             Appears normal         Lower             Previously seen
                                          Extremities:
 LVOT:             Previously seen        Upper             Previously seen
                                          Extremities:

 Other:  Technically difficult due to fetal position. Fetus appears to be a
         female. Heels and 5th digit previously seen. Nasal bone visualized.
Cervix Uterus Adnexa

 Cervical Length:    3.65     cm

 Cervix:       Normal appearance by transabdominal scan.

 Left Ovary:    Size(cm) L: 2.71 x W: 2.56 x H: 1.41  Volume(cc):
 Right Ovary:   Within normal limits.
Comments

 Echogenic intracardiac focus(EIF):  This finding has been
 implicated as a marker for increased risk of aneuploidy,
 especially Trisomy 21. However, it is also seen as a normal
 variant in 2-4% of all pregnancy ultrasound exams. Although
 its significance as an isolated finding is not entirely clear, a
 consensus opinion would be that an isolated EIF (with no
 other risk factors or suspicious ultrasound findings) does not
 significantly increase risk of aneuploidy. The finding of the
 echogenic focus is not a "heart defect" and no special fetal
 surveillance or neonatal cardiac evaluation is recommended.
Impression

 SIUP at 98w6d
 EFW 71st%
 Apparently isolated EIF
 No structural defects
 No previa
Recommendations

 Follow up ultrasounds as clinically indicated.

 questions or concerns.

## 2015-11-16 ENCOUNTER — Other Ambulatory Visit: Payer: Self-pay | Admitting: Student

## 2015-11-16 ENCOUNTER — Ambulatory Visit (INDEPENDENT_AMBULATORY_CARE_PROVIDER_SITE_OTHER): Payer: Medicaid Other | Admitting: Student

## 2015-11-16 ENCOUNTER — Encounter: Payer: Self-pay | Admitting: Student

## 2015-11-16 VITALS — BP 137/73 | HR 63 | Ht 66.0 in | Wt 235.0 lb

## 2015-11-16 DIAGNOSIS — Z975 Presence of (intrauterine) contraceptive device: Principal | ICD-10-CM

## 2015-11-16 DIAGNOSIS — Z01419 Encounter for gynecological examination (general) (routine) without abnormal findings: Secondary | ICD-10-CM | POA: Diagnosis not present

## 2015-11-16 DIAGNOSIS — Z309 Encounter for contraceptive management, unspecified: Secondary | ICD-10-CM

## 2015-11-16 DIAGNOSIS — Z124 Encounter for screening for malignant neoplasm of cervix: Secondary | ICD-10-CM

## 2015-11-16 DIAGNOSIS — Z0000 Encounter for general adult medical examination without abnormal findings: Secondary | ICD-10-CM

## 2015-11-16 DIAGNOSIS — N921 Excessive and frequent menstruation with irregular cycle: Secondary | ICD-10-CM

## 2015-11-16 DIAGNOSIS — Z113 Encounter for screening for infections with a predominantly sexual mode of transmission: Secondary | ICD-10-CM

## 2015-11-16 LAB — TSH: TSH: 1.19 mIU/L

## 2015-11-16 NOTE — Patient Instructions (Signed)
Health Maintenance, Female Adopting a healthy lifestyle and getting preventive care can go a long way to promote health and wellness. Talk with your health care provider about what schedule of regular examinations is right for you. This is a good chance for you to check in with your provider about disease prevention and staying healthy. In between checkups, there are plenty of things you can do on your own. Experts have done a lot of research about which lifestyle changes and preventive measures are most likely to keep you healthy. Ask your health care provider for more information. WEIGHT AND DIET  Eat a healthy diet  Be sure to include plenty of vegetables, fruits, low-fat dairy products, and lean protein.  Do not eat a lot of foods high in solid fats, added sugars, or salt.  Get regular exercise. This is one of the most important things you can do for your health.  Most adults should exercise for at least 150 minutes each week. The exercise should increase your heart rate and make you sweat (moderate-intensity exercise).  Most adults should also do strengthening exercises at least twice a week. This is in addition to the moderate-intensity exercise.  Maintain a healthy weight  Body mass index (BMI) is a measurement that can be used to identify possible weight problems. It estimates body fat based on height and weight. Your health care provider can help determine your BMI and help you achieve or maintain a healthy weight.  For females 20 years of age and older:   A BMI below 18.5 is considered underweight.  A BMI of 18.5 to 24.9 is normal.  A BMI of 25 to 29.9 is considered overweight.  A BMI of 30 and above is considered obese.  Watch levels of cholesterol and blood lipids  You should start having your blood tested for lipids and cholesterol at 22 years of age, then have this test every 5 years.  You may need to have your cholesterol levels checked more often if:  Your lipid  or cholesterol levels are high.  You are older than 22 years of age.  You are at high risk for heart disease.  CANCER SCREENING   Lung Cancer  Lung cancer screening is recommended for adults 55-80 years old who are at high risk for lung cancer because of a history of smoking.  A yearly low-dose CT scan of the lungs is recommended for people who:  Currently smoke.  Have quit within the past 15 years.  Have at least a 30-pack-year history of smoking. A pack year is smoking an average of one pack of cigarettes a day for 1 year.  Yearly screening should continue until it has been 15 years since you quit.  Yearly screening should stop if you develop a health problem that would prevent you from having lung cancer treatment.  Breast Cancer  Practice breast self-awareness. This means understanding how your breasts normally appear and feel.  It also means doing regular breast self-exams. Let your health care provider know about any changes, no matter how small.  If you are in your 20s or 30s, you should have a clinical breast exam (CBE) by a health care provider every 1-3 years as part of a regular health exam.  If you are 40 or older, have a CBE every year. Also consider having a breast X-ray (mammogram) every year.  If you have a family history of breast cancer, talk to your health care provider about genetic screening.  If you   are at high risk for breast cancer, talk to your health care provider about having an MRI and a mammogram every year.  Breast cancer gene (BRCA) assessment is recommended for women who have family members with BRCA-related cancers. BRCA-related cancers include:  Breast.  Ovarian.  Tubal.  Peritoneal cancers.  Results of the assessment will determine the need for genetic counseling and BRCA1 and BRCA2 testing. Cervical Cancer Your health care provider may recommend that you be screened regularly for cancer of the pelvic organs (ovaries, uterus, and  vagina). This screening involves a pelvic examination, including checking for microscopic changes to the surface of your cervix (Pap test). You may be encouraged to have this screening done every 3 years, beginning at age 21.  For women ages 30-65, health care providers may recommend pelvic exams and Pap testing every 3 years, or they may recommend the Pap and pelvic exam, combined with testing for human papilloma virus (HPV), every 5 years. Some types of HPV increase your risk of cervical cancer. Testing for HPV may also be done on women of any age with unclear Pap test results.  Other health care providers may not recommend any screening for nonpregnant women who are considered low risk for pelvic cancer and who do not have symptoms. Ask your health care provider if a screening pelvic exam is right for you.  If you have had past treatment for cervical cancer or a condition that could lead to cancer, you need Pap tests and screening for cancer for at least 20 years after your treatment. If Pap tests have been discontinued, your risk factors (such as having a new sexual partner) need to be reassessed to determine if screening should resume. Some women have medical problems that increase the chance of getting cervical cancer. In these cases, your health care provider may recommend more frequent screening and Pap tests. Colorectal Cancer  This type of cancer can be detected and often prevented.  Routine colorectal cancer screening usually begins at 22 years of age and continues through 22 years of age.  Your health care provider may recommend screening at an earlier age if you have risk factors for colon cancer.  Your health care provider may also recommend using home test kits to check for hidden blood in the stool.  A small camera at the end of a tube can be used to examine your colon directly (sigmoidoscopy or colonoscopy). This is done to check for the earliest forms of colorectal  cancer.  Routine screening usually begins at age 50.  Direct examination of the colon should be repeated every 5-10 years through 22 years of age. However, you may need to be screened more often if early forms of precancerous polyps or small growths are found. Skin Cancer  Check your skin from head to toe regularly.  Tell your health care provider about any new moles or changes in moles, especially if there is a change in a mole's shape or color.  Also tell your health care provider if you have a mole that is larger than the size of a pencil eraser.  Always use sunscreen. Apply sunscreen liberally and repeatedly throughout the day.  Protect yourself by wearing long sleeves, pants, a wide-brimmed hat, and sunglasses whenever you are outside. HEART DISEASE, DIABETES, AND HIGH BLOOD PRESSURE   High blood pressure causes heart disease and increases the risk of stroke. High blood pressure is more likely to develop in:  People who have blood pressure in the high end   of the normal range (130-139/85-89 mm Hg).  People who are overweight or obese.  People who are African American.  If you are 38-23 years of age, have your blood pressure checked every 3-5 years. If you are 61 years of age or older, have your blood pressure checked every year. You should have your blood pressure measured twice--once when you are at a hospital or clinic, and once when you are not at a hospital or clinic. Record the average of the two measurements. To check your blood pressure when you are not at a hospital or clinic, you can use:  An automated blood pressure machine at a pharmacy.  A home blood pressure monitor.  If you are between 45 years and 39 years old, ask your health care provider if you should take aspirin to prevent strokes.  Have regular diabetes screenings. This involves taking a blood sample to check your fasting blood sugar level.  If you are at a normal weight and have a low risk for diabetes,  have this test once every three years after 22 years of age.  If you are overweight and have a high risk for diabetes, consider being tested at a younger age or more often. PREVENTING INFECTION  Hepatitis B  If you have a higher risk for hepatitis B, you should be screened for this virus. You are considered at high risk for hepatitis B if:  You were born in a country where hepatitis B is common. Ask your health care provider which countries are considered high risk.  Your parents were born in a high-risk country, and you have not been immunized against hepatitis B (hepatitis B vaccine).  You have HIV or AIDS.  You use needles to inject street drugs.  You live with someone who has hepatitis B.  You have had sex with someone who has hepatitis B.  You get hemodialysis treatment.  You take certain medicines for conditions, including cancer, organ transplantation, and autoimmune conditions. Hepatitis C  Blood testing is recommended for:  Everyone born from [birth year] through 1967.  Anyone with known risk factors for hepatitis C. Sexually transmitted infections (STIs)  You should be screened for sexually transmitted infections (STIs) including gonorrhea and chlamydia if:  You are sexually active and are younger than 22 years of age.  You are older than 22 years of age and your health care provider tells you that you are at risk for this type of infection.  Your sexual activity has changed since you were last screened and you are at an increased risk for chlamydia or gonorrhea. Ask your health care provider if you are at risk.  If you do not have HIV, but are at risk, it may be recommended that you take a prescription medicine daily to prevent HIV infection. This is called pre-exposure prophylaxis (PrEP). You are considered at risk if:  You are sexually active and do not regularly use condoms or know the HIV status of your partner(s).  You take drugs by injection.  You are sexually  active with a partner who has HIV. Talk with your health care provider about whether you are at high risk of being infected with HIV. If you choose to begin PrEP, you should first be tested for HIV. You should then be tested every 3 months for as long as you are taking PrEP.  PREGNANCY   If you are premenopausal and you may become pregnant, ask your health care provider about preconception counseling.  If you may  become pregnant, take 400 to 800 micrograms (mcg) of folic acid every day.  If you want to prevent pregnancy, talk to your health care provider about birth control (contraception). OSTEOPOROSIS AND MENOPAUSE   Osteoporosis is a disease in which the bones lose minerals and strength with aging. This can result in serious bone fractures. Your risk for osteoporosis can be identified using a bone density scan.  If you are 61 years of age or older, or if you are at risk for osteoporosis and fractures, ask your health care provider if you should be screened.  Ask your health care provider whether you should take a calcium or vitamin D supplement to lower your risk for osteoporosis.  Menopause may have certain physical symptoms and risks.  Hormone replacement therapy may reduce some of these symptoms and risks. Talk to your health care provider about whether hormone replacement therapy is right for you.  HOME CARE INSTRUCTIONS   Schedule regular health, dental, and eye exams.  Stay current with your immunizations.   Do not use any tobacco products including cigarettes, chewing tobacco, or electronic cigarettes.  If you are pregnant, do not drink alcohol.  If you are breastfeeding, limit how much and how often you drink alcohol.  Limit alcohol intake to no more than 1 drink per day for nonpregnant women. One drink equals 12 ounces of beer, 5 ounces of wine, or 1 ounces of hard liquor.  Do not use street drugs.  Do not share needles.  Ask your health care provider for help if  you need support or information about quitting drugs.  Tell your health care provider if you often feel depressed.  Tell your health care provider if you have ever been abused or do not feel safe at home.   This information is not intended to replace advice given to you by your health care provider. Make sure you discuss any questions you have with your health care provider.   Document Released: 07/19/2010 Document Revised: 01/24/2014 Document Reviewed: 12/05/2012 Elsevier Interactive Patient Education [employer].

## 2015-11-16 NOTE — Progress Notes (Deleted)
   Subjective:    Patient ID: Sheryl OaksHanna Sensabaugh, female    DOB: 12/26/1995, 20 y.o.   MRN: [id number]  HPI    Review of Systems     Objective:   Physical Exam        Assessment & Plan:

## 2015-11-16 NOTE — Progress Notes (Signed)
   Subjective:    Patient ID: Sheryl Pitts, female    DOB: 09/12/1995, 20 y.o.   MRN: [id number]  HPI Sheryl Pitts is a 20 y.o. 41P1001 female who presents to clinic today for routine well woman exam with pap smear. Pt states she has never had a pap smear. Is in monogamous relationship with the father of her child x 5 years. She has had nexplanon in place for 2 years. She was initially happy with this method of contraception until the last 6 months when she reports episodes of vaginal bleeding that has increased in frequency & flow.   Review of Systems  Constitutional: Positive for fatigue. Negative for activity change, appetite change, diaphoresis, fever and unexpected weight change.  Cardiovascular: Negative for chest pain, palpitations and leg swelling.  Gastrointestinal: Negative for abdominal pain, nausea and vomiting.  Endocrine: Negative for cold intolerance, heat intolerance, polydipsia and polyuria.  Genitourinary: Positive for menstrual problem. Negative for dyspareunia, frequency, genital sores, pelvic pain, vaginal bleeding, vaginal discharge and vaginal pain.  Psychiatric/Behavioral: Positive for agitation (reports occ "mood swings" for the last 2 months. No SI/HI). Negative for self-injury and suicidal ideas.      Objective:   Physical Exam  Constitutional: Vital signs are normal. She appears well-developed and well-nourished.  obese  HENT:  Head: Normocephalic and atraumatic.  Mouth/Throat: Uvula is midline, oropharynx is clear and moist and mucous membranes are normal. No dental caries.  Neck: Neck supple. No muscular tenderness present. No thyroid mass and no thyromegaly present.  Cardiovascular: Normal rate, regular rhythm and normal heart sounds.   Pulmonary/Chest: Effort normal and breath sounds normal.  Normal breast exam  Abdominal: Soft. Normal appearance and bowel sounds are normal. There is no tenderness.  Genitourinary: Uterus normal. No breast swelling, tenderness,  discharge or bleeding. Pelvic exam was performed with patient supine. Cervix exhibits no motion tenderness and no friability. Right adnexum displays no mass and no tenderness. Left adnexum displays no mass and no tenderness. No erythema or bleeding in the vagina. Vaginal discharge (small amount of thin white discharge) found.  Lymphadenopathy:    She has no cervical adenopathy.    She has no axillary adenopathy.  Neurological: She is alert.  Skin:  Hirsutism noted on chin & neck  Psychiatric: She has a normal mood and affect. Her speech is normal and behavior is normal. Judgment and thought content normal. Cognition and memory are normal. She expresses no homicidal and no suicidal ideation.      BP 137/73   Pulse 63   Ht 5\' 6"  (1.676 m)   Wt 235 lb (106.6 kg)   LMP 11/12/2015 (Exact Date)   Breastfeeding? No   BMI 37.93 kg/m   Assessment & Plan:  Will continue nexplanon for now. Labs drawn to evaluate for thyroid and r/o PCOS. Pt plans on starting with PCP at beginning of new year d/t new job and new insurance.   1. Well woman exam with routine gynecological exam  - Cytology - PAP - GC[facility]Chlamydia probe amp [facility] - TSH - HgB A1c - Testosterone - Testosterone, free - Testosterone, % free - Sex hormone binding globulin

## 2015-11-17 LAB — TESTOSTERONE, FREE AND TOTAL (INCLUDES SHBG)-(MALES)
Sex Hormone Binding: 17 nmol/L (ref 17–124)
Testosterone, Free: 16.4 pg/mL — ABNORMAL HIGH (ref 0.6–6.8)
Testosterone-% Free: 2.5 % — ABNORMAL HIGH (ref 0.4–2.4)
Testosterone: 65 ng/dL

## 2015-11-17 LAB — GC/CHLAMYDIA PROBE AMP (~~LOC~~) NOT AT ARMC
Chlamydia: NEGATIVE
Neisseria Gonorrhea: NEGATIVE

## 2015-11-17 LAB — HEMOGLOBIN A1C
HEMOGLOBIN A1C: 5.1 % (ref <5.7)
Mean Plasma Glucose: 100 mg/dL

## 2015-11-18 LAB — CYTOLOGY - PAP
Diagnosis: NEGATIVE
HPV: NOT DETECTED

## 2015-11-20 ENCOUNTER — Other Ambulatory Visit: Payer: Self-pay | Admitting: Student

## 2015-11-20 DIAGNOSIS — Z975 Presence of (intrauterine) contraceptive device: Principal | ICD-10-CM

## 2015-11-20 DIAGNOSIS — N921 Excessive and frequent menstruation with irregular cycle: Secondary | ICD-10-CM

## 2015-11-20 MED ORDER — NORGESTIMATE-ETH ESTRADIOL 0.25-35 MG-MCG PO TABS: 1.0000 | ORAL_TABLET | Freq: Every day | ORAL | 0 refills | Status: DC

## 2015-12-21 ENCOUNTER — Other Ambulatory Visit: Payer: Self-pay | Admitting: Student

## 2015-12-21 DIAGNOSIS — N921 Excessive and frequent menstruation with irregular cycle: Secondary | ICD-10-CM

## 2015-12-21 DIAGNOSIS — Z975 Presence of (intrauterine) contraceptive device: Principal | ICD-10-CM

## 2017-11-23 ENCOUNTER — Other Ambulatory Visit: Payer: Self-pay

## 2017-11-23 ENCOUNTER — Emergency Department (HOSPITAL_COMMUNITY)
Admission: EM | Admit: 2017-11-23 | Discharge: 2017-11-23 | Disposition: A | Discharge status: Home / Self Care | Payer: Self-pay | Attending: Emergency Medicine | Admitting: Emergency Medicine

## 2017-11-23 DIAGNOSIS — R55 Syncope and collapse: Secondary | ICD-10-CM | POA: Insufficient documentation

## 2017-11-23 DIAGNOSIS — Z79899 Other long term (current) drug therapy: Secondary | ICD-10-CM | POA: Insufficient documentation

## 2017-11-23 LAB — CBC WITH DIFFERENTIAL/PLATELET
Abs Immature Granulocytes: 0.06 10*3/uL (ref 0.00–0.07)
Basophils Absolute: 0.1 10*3/uL (ref 0.0–0.1)
Basophils Relative: 1 %
Eosinophils Absolute: 0.2 10*3/uL (ref 0.0–0.5)
Eosinophils Relative: 2 %
HCT: 52.8 % — ABNORMAL HIGH (ref 36.0–46.0)
Hemoglobin: 16.7 g/dL — ABNORMAL HIGH (ref 12.0–15.0)
Immature Granulocytes: 1 %
Lymphocytes Relative: 24 %
Lymphs Abs: 3.2 10*3/uL (ref 0.7–4.0)
MCH: 29.5 pg (ref 26.0–34.0)
MCHC: 31.6 g/dL (ref 30.0–36.0)
MCV: 93.1 fL (ref 80.0–100.0)
Monocytes Absolute: 1 10*3/uL (ref 0.1–1.0)
Monocytes Relative: 8 %
Neutro Abs: 8.7 10*3/uL — ABNORMAL HIGH (ref 1.7–7.7)
Neutrophils Relative %: 64 %
Platelets: 310 10*3/uL (ref 150–400)
RBC: 5.67 MIL/uL — ABNORMAL HIGH (ref 3.87–5.11)
RDW: 12.4 % (ref 11.5–15.5)
WBC: 13.3 10*3/uL — ABNORMAL HIGH (ref 4.0–10.5)
nRBC: 0 % (ref 0.0–0.2)

## 2017-11-23 LAB — BASIC METABOLIC PANEL
Anion gap: 10 (ref 5–15)
BUN: 8 mg/dL (ref 6–20)
CO2: 20 mmol/L — ABNORMAL LOW (ref 22–32)
Calcium: 8.8 mg/dL — ABNORMAL LOW (ref 8.9–10.3)
Chloride: 108 mmol/L (ref 98–111)
Creatinine, Ser: 0.82 mg/dL (ref 0.44–1.00)
GFR calc Af Amer: >60 mL/min (ref >60)
GFR calc non Af Amer: >60 mL/min (ref >60)
Glucose, Bld: 91 mg/dL (ref 70–99)
Potassium: 4.1 mmol/L (ref 3.5–5.1)
Sodium: 138 mmol/L (ref 135–145)

## 2017-11-23 LAB — CBG MONITORING, ED: GLUCOSE-CAPILLARY: 108 mg/dL — AB (ref 70–99)

## 2017-11-23 LAB — POC URINE PREG, ED: Preg Test, Ur: NEGATIVE

## 2017-11-23 MED ORDER — SODIUM CHLORIDE 0.9 % IV BOLUS
1000.0000 mL | Freq: Once | INTRAVENOUS | Status: AC
  Administered 2017-11-23: 1000 mL via INTRAVENOUS

## 2017-11-23 NOTE — ED Notes (Signed)
Declined W/C at D/C and was escorted to lobby by RN. 

## 2017-11-23 NOTE — ED Triage Notes (Signed)
Pt was at work , felt like she had palopitations and then got dizzy and hot and friend caught her before she hit her head,  Woke up on floor pt last ate at 1000, no period due to [facility] shot

## 2017-11-23 NOTE — ED Notes (Addendum)
Pt ambulated in hallway on pulse ox. O2 saturation stayed between 97-100%. Pts gait was steady and she had no complaints of dizziness or lightheadedness.

## 2017-11-23 NOTE — Discharge Instructions (Signed)
Drink plenty of fluids and get plenty of rest.  Follow-up with your primary care physician or cardiologist on an outpatient basis for reevaluation of your palpitations.  They may want to do Holter monitoring which can check to see if your heart goes into an abnormal rhythm when you feel the palpitations.  Return to the emergency department immediately for any concerning signs or symptoms develop such as worsening shortness of breath, chest pain, or passing out again.

## 2017-11-23 NOTE — ED Provider Notes (Signed)
[facility] EMERGENCY DEPARTMENT Provider Note   CSN: [id number] Arrival date & time: 11/23/17  1138     History   Chief Complaint Chief Complaint  Patient presents with  . Loss of Consciousness    HPI Sheryl Pitts is a 22 y.o. female presents for evaluation of acute onset, resolved syncopal episode.  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  She states that she had bent over to pour some Gatorade when she began to feel palpitations, lightheadedness, nausea, chest pressure and shortness of breath. She states that she felt very flushed/hot at the time. She notes some blurred vision at the time and apparently lost consciousness for a few seconds.  She states that her nurse was able to catch her before she fell so she had no head injury.  States that directly afterwards she began to feel a little "groggy"which lasted for a few minutes before resolving.  She states she is currently back to her baseline. No seizure like activity.  She denies any numbness or tingling.  She states that she has a history of palpitations but it has never caused her to become lightheaded or pass out.  No recent travel or surgeries, no leg swelling, no prior history of DVT or PE, no hemoptysis.  She is currently on the Depo-Provera shot for birth control.  States that she thinks she could be dehydrated. She denies any symptoms at this time.   The history is provided by the patient.    Past Medical History:  Diagnosis Date  . Scoliosis     Patient Active Problem List   Diagnosis Date Noted  . Obesity 10/31/2013    Past Surgical History:  Procedure Laterality Date  . WISDOM TOOTH EXTRACTION       OB History    Gravida  1   Para  1   Term  1   Preterm      AB      Living  1     SAB      TAB      Ectopic      Multiple  0   Live Births  1            Home Medications    Prior to Admission medications   Medication Sig Start Date End Date  Taking? Authorizing Provider  SPRINTEC 28 0.25-35 MG-MCG tablet TAKE ONE TABLET BY MOUTH ONCE DAILY 12/21/15   Judeth Horn, NP    Family History Family History  Problem Relation Age of Onset  . Hypertension Father   . Diabetes Neg Hx     Social History Social History   Tobacco Use  . Smoking status: Never Smoker  . Smokeless tobacco: Never Used  Substance Use Topics  . Alcohol use: Yes    Comment: occasional drinking, 1/month  . Drug use: No     Allergies   Patient has no known allergies.   Review of Systems Review of Systems  Constitutional: Negative for chills and fever.  Respiratory: Positive for shortness of breath (resolved).   Cardiovascular: Positive for chest pain (resolved) and palpitations. Negative for leg swelling.  Gastrointestinal: Positive for nausea. Negative for abdominal pain and vomiting.  Neurological: Positive for syncope and light-headedness. Negative for headaches.  All other systems reviewed and are negative.    Physical Exam Updated Vital Signs BP 115/79 (BP Location: Left Arm)   Pulse 72   Temp 98 F (36.7 C) (Oral)   Resp  18   SpO2 98%   Physical Exam  Constitutional: She is oriented to person, place, and time. She appears well-developed and well-nourished. No distress.  HENT:  Head: Normocephalic and atraumatic.  Eyes: Conjunctivae are normal. Right eye exhibits no discharge. Left eye exhibits no discharge.  Neck: No JVD present. No tracheal deviation present.  Cardiovascular: Normal rate, regular rhythm, normal heart sounds and intact distal pulses.  No murmur heard. Pulmonary/Chest: Effort normal and breath sounds normal.  Abdominal: Soft. Bowel sounds are normal. She exhibits no distension. There is no tenderness. There is no guarding.  Musculoskeletal: She exhibits no edema.  Neurological: She is alert and oriented to person, place, and time. No cranial nerve deficit or sensory deficit. She exhibits normal muscle tone.    Mental Status:  Alert, thought content appropriate, able to give a coherent history. Speech fluent without evidence of aphasia. Able to follow 2 step commands without difficulty.  Cranial Nerves:  II:  Peripheral visual fields grossly normal, pupils equal, round, reactive to light III,IV, VI: ptosis not present, extra-ocular motions intact bilaterally  V,VII: smile symmetric, facial light touch sensation equal VIII: hearing grossly normal to voice  X: uvula elevates symmetrically  XI: bilateral shoulder shrug symmetric and strong XII: midline tongue extension without fassiculations Motor:  Normal tone. 5/5 strength of BUE and BLE major muscle groups including strong and equal grip strength and dorsiflexion/plantar flexion Sensory: light touch normal in all extremities. DTRs: biceps and achilles 2+ symmetric b/l Cerebellar: normal finger-to-nose with bilateral upper extremities Gait: normal gait and balance. Able to walk on toes and heels with ease.  CV: 2+ radial and DP/PT pulses   Skin: Skin is warm and dry. No erythema.  Psychiatric: She has a normal mood and affect. Her behavior is normal.  Nursing note and vitals reviewed.    ED Treatments / Results  Labs (all labs ordered are listed, but only abnormal results are displayed) Labs Reviewed  BASIC METABOLIC PANEL - Abnormal; Notable for the following components:      Result Value   CO2 20 (*)    Calcium 8.8 (*)    All other components within normal limits  CBC WITH DIFFERENTIAL/PLATELET - Abnormal; Notable for the following components:   WBC 13.3 (*)    RBC 5.67 (*)    Hemoglobin 16.7 (*)    HCT 52.8 (*)    Neutro Abs 8.7 (*)    All other components within normal limits  CBG MONITORING, ED - Abnormal; Notable for the following components:   Glucose-Capillary 108 (*)    All other components within normal limits  POC URINE PREG, ED    EKG EKG Interpretation  Date/Time:  Thursday November 23 2017 12:02:15  EST Ventricular Rate:  75 PR Interval:  128 QRS Duration: 76 QT Interval:  340 QTC Calculation: 379 R Axis:   81 Text Interpretation:  Normal sinus rhythm with sinus arrhythmia Normal ECG No previous ECGs available Confirmed by Frederick Peers [phone]) on 11/23/2017 1:17:38 PM   Radiology No results found.  Procedures Procedures (including critical care time)  Medications Ordered in ED Medications  sodium chloride 0.9 % bolus 1,000 mL (0 mLs Intravenous Stopped 11/23/17 1443)     Initial Impression / Assessment and Plan / ED Course  I have reviewed the triage vital signs and the nursing notes.  Pertinent labs & imaging results that were available during my care of the patient were reviewed by me and considered in my medical decision making (  see chart for details).     Patient presents for evaluation of syncopal episode.  She is afebrile, initially very mildly tachycardic but completely resolved on my assessment and on subsequent evaluations.  Vital signs otherwise stable.  She is nontoxic in appearance.  Neurovascularly intact, no focal neurologic deficits.  No head injury.  Completely asymptomatic on my assessment.  EKG shows normal sinus rhythm, no ischemic changes. Doubt ACS/MI in a low-risk patient.  She is low risk per Wells criteria and I have a very low suspicion in the absence of risk factors.  She has no ongoing shortness of breath, and while in the ED she has not been hypoxic or tachypneic.  She was ambulated with pulse oximetry with stable SPO2 saturations with no complaint of dizziness, lightheadedness, or shortness of breath.  CBC suggests she is hemoconcentrated, likely dehydrated.  She is not orthostatic.  Remainder of lab work reviewed by me shows no metabolic derangements.  Creatinine within normal limits.  She is not pregnant.  She was given IV fluids and monitored in the ED without any acute changes.  On reevaluation she is resting comfortably no apparent distress,  tolerating p.o. food and fluids without difficulty.  Stable for discharge home with follow-up with cardiology on an outpatient basis for Holter monitoring.  She has been having palpitations on a monthly basis for several months but they are not typically associated with any additional symptoms.  Discussed strict ED return precautions. Pt verbalized understanding of and agreement with plan and is safe for discharge home at this time.  Patient seen and evaluated by Dr. Clarene Duke who agrees with assessment and plan at this time.  Final Clinical Impressions(s) / ED Diagnoses   Final diagnoses:  Syncope and collapse    ED Discharge Orders    None       Jeanie Sewer, PA-C 11/23/17 1620    Little, Ambrose Finland, MD 11/30/17 2056

## 2022-09-05 ENCOUNTER — Other Ambulatory Visit: Payer: Self-pay | Admitting: Oncology

## 2022-09-05 DIAGNOSIS — Z006 Encounter for examination for normal comparison and control in clinical research program: Secondary | ICD-10-CM

## 2022-11-24 ENCOUNTER — Ambulatory Visit: Payer: Medicaid Other | Admitting: Allergy

## 2022-12-01 ENCOUNTER — Ambulatory Visit: Payer: Medicaid Other | Admitting: Allergy

## 2023-05-11 ENCOUNTER — Other Ambulatory Visit (HOSPITAL_COMMUNITY): Payer: Self-pay

## 2023-11-15 ENCOUNTER — Other Ambulatory Visit: Payer: Self-pay | Admitting: Medical Genetics

## 2023-11-15 DIAGNOSIS — Z006 Encounter for examination for normal comparison and control in clinical research program: Secondary | ICD-10-CM
